# Patient Record
Sex: Female | Born: 1980 | Marital: Married | Smoking: Never smoker
Health system: Southern US, Community
[De-identification: ages and names within clinical notes are randomized; demographics above are authoritative.]

## PROBLEM LIST (undated history)

## (undated) DIAGNOSIS — F419 Anxiety disorder, unspecified: Secondary | ICD-10-CM

## (undated) HISTORY — DX: Anxiety disorder, unspecified: F41.9

---
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
## 2016-06-12 DIAGNOSIS — D2262 Melanocytic nevi of left upper limb, including shoulder: Secondary | ICD-10-CM | POA: Diagnosis not present

## 2016-06-12 DIAGNOSIS — D225 Melanocytic nevi of trunk: Secondary | ICD-10-CM | POA: Diagnosis not present

## 2016-06-12 DIAGNOSIS — D485 Neoplasm of uncertain behavior of skin: Secondary | ICD-10-CM | POA: Diagnosis not present

## 2016-06-12 DIAGNOSIS — C44519 Basal cell carcinoma of skin of other part of trunk: Secondary | ICD-10-CM | POA: Diagnosis not present

## 2016-06-12 DIAGNOSIS — D2362 Other benign neoplasm of skin of left upper limb, including shoulder: Secondary | ICD-10-CM | POA: Diagnosis not present

## 2016-06-12 DIAGNOSIS — L821 Other seborrheic keratosis: Secondary | ICD-10-CM | POA: Diagnosis not present

## 2016-09-30 DIAGNOSIS — Z01419 Encounter for gynecological examination (general) (routine) without abnormal findings: Secondary | ICD-10-CM | POA: Diagnosis not present

## 2016-09-30 DIAGNOSIS — Z6820 Body mass index (BMI) 20.0-20.9, adult: Secondary | ICD-10-CM | POA: Diagnosis not present

## 2016-10-07 DIAGNOSIS — Z85828 Personal history of other malignant neoplasm of skin: Secondary | ICD-10-CM | POA: Diagnosis not present

## 2016-10-07 DIAGNOSIS — L814 Other melanin hyperpigmentation: Secondary | ICD-10-CM | POA: Diagnosis not present

## 2016-10-07 DIAGNOSIS — D1801 Hemangioma of skin and subcutaneous tissue: Secondary | ICD-10-CM | POA: Diagnosis not present

## 2016-10-07 DIAGNOSIS — D225 Melanocytic nevi of trunk: Secondary | ICD-10-CM | POA: Diagnosis not present

## 2016-10-07 DIAGNOSIS — D485 Neoplasm of uncertain behavior of skin: Secondary | ICD-10-CM | POA: Diagnosis not present

## 2016-10-07 DIAGNOSIS — L821 Other seborrheic keratosis: Secondary | ICD-10-CM | POA: Diagnosis not present

## 2016-10-16 DIAGNOSIS — D225 Melanocytic nevi of trunk: Secondary | ICD-10-CM | POA: Diagnosis not present

## 2016-10-16 DIAGNOSIS — D485 Neoplasm of uncertain behavior of skin: Secondary | ICD-10-CM | POA: Diagnosis not present

## 2017-02-24 ENCOUNTER — Telehealth: Payer: Self-pay | Admitting: Emergency Medicine

## 2017-02-24 ENCOUNTER — Encounter: Payer: Self-pay | Admitting: Emergency Medicine

## 2017-02-24 ENCOUNTER — Ambulatory Visit: Payer: Self-pay | Admitting: Family Medicine

## 2017-02-24 NOTE — Telephone Encounter (Signed)
Pre visit attempted left voicemail for pt to return call to office.  

## 2017-02-24 NOTE — Telephone Encounter (Signed)
Pre visit complete °

## 2017-02-26 ENCOUNTER — Ambulatory Visit: Payer: Self-pay | Admitting: Family Medicine

## 2017-02-27 ENCOUNTER — Encounter: Payer: Self-pay | Admitting: Family Medicine

## 2017-02-27 ENCOUNTER — Ambulatory Visit (INDEPENDENT_AMBULATORY_CARE_PROVIDER_SITE_OTHER): Payer: Federal, State, Local not specified - PPO | Admitting: Family Medicine

## 2017-02-27 VITALS — BP 106/68 | HR 76 | Temp 98.5°F | Ht 68.0 in | Wt 136.0 lb

## 2017-02-27 DIAGNOSIS — Z1231 Encounter for screening mammogram for malignant neoplasm of breast: Secondary | ICD-10-CM

## 2017-02-27 DIAGNOSIS — K6289 Other specified diseases of anus and rectum: Secondary | ICD-10-CM

## 2017-02-27 DIAGNOSIS — Z1239 Encounter for other screening for malignant neoplasm of breast: Secondary | ICD-10-CM

## 2017-02-27 DIAGNOSIS — K594 Anal spasm: Secondary | ICD-10-CM

## 2017-02-27 DIAGNOSIS — F419 Anxiety disorder, unspecified: Secondary | ICD-10-CM | POA: Diagnosis not present

## 2017-02-27 DIAGNOSIS — Z0001 Encounter for general adult medical examination with abnormal findings: Secondary | ICD-10-CM

## 2017-02-27 DIAGNOSIS — Z0000 Encounter for general adult medical examination without abnormal findings: Secondary | ICD-10-CM

## 2017-02-27 LAB — COMPREHENSIVE METABOLIC PANEL
ALT: 9 U/L (ref 0–35)
AST: 13 U/L (ref 0–37)
Albumin: 4.4 g/dL (ref 3.5–5.2)
Alkaline Phosphatase: 52 U/L (ref 39–117)
BUN: 4 mg/dL — ABNORMAL LOW (ref 6–23)
CO2: 28 mEq/L (ref 19–32)
Calcium: 9.4 mg/dL (ref 8.4–10.5)
Chloride: 105 mEq/L (ref 96–112)
Creatinine, Ser: 0.78 mg/dL (ref 0.40–1.20)
GFR: 87.62 mL/min (ref >60.00)
Glucose, Bld: 91 mg/dL (ref 70–99)
Potassium: 4.7 mEq/L (ref 3.5–5.1)
Sodium: 140 mEq/L (ref 135–145)
Total Bilirubin: 0.6 mg/dL (ref 0.2–1.2)
Total Protein: 6.6 g/dL (ref 6.0–8.3)

## 2017-02-27 LAB — LIPID PANEL
Cholesterol: 139 mg/dL (ref 0–200)
HDL: 68.7 mg/dL (ref >39.00)
LDL Cholesterol: 58 mg/dL (ref 0–99)
NonHDL: 70.33
Total CHOL/HDL Ratio: 2
Triglycerides: 64 mg/dL (ref 0.0–149.0)
VLDL: 12.8 mg/dL (ref 0.0–40.0)

## 2017-02-27 LAB — CBC WITH DIFFERENTIAL/PLATELET
Basophils Absolute: 0 10*3/uL (ref 0.0–0.1)
Basophils Relative: 0.9 % (ref 0.0–3.0)
Eosinophils Absolute: 0.2 10*3/uL (ref 0.0–0.7)
Eosinophils Relative: 4.2 % (ref 0.0–5.0)
HCT: 42.2 % (ref 36.0–46.0)
Hemoglobin: 14 g/dL (ref 12.0–15.0)
Lymphocytes Relative: 25.3 % (ref 12.0–46.0)
Lymphs Abs: 1.2 10*3/uL (ref 0.7–4.0)
MCHC: 33.3 g/dL (ref 30.0–36.0)
MCV: 91 fl (ref 78.0–100.0)
Monocytes Absolute: 0.3 10*3/uL (ref 0.1–1.0)
Monocytes Relative: 5.7 % (ref 3.0–12.0)
Neutro Abs: 3.1 10*3/uL (ref 1.4–7.7)
Neutrophils Relative %: 63.9 % (ref 43.0–77.0)
Platelets: 226 10*3/uL (ref 150.0–400.0)
RBC: 4.64 Mil/uL (ref 3.87–5.11)
RDW: 12.9 % (ref 11.5–15.5)
WBC: 4.8 10*3/uL (ref 4.0–10.5)

## 2017-02-27 LAB — T4, FREE: Free T4: 0.68 ng/dL (ref 0.60–1.60)

## 2017-02-27 LAB — TSH: TSH: 1.49 u[IU]/mL (ref 0.35–4.50)

## 2017-02-27 NOTE — Patient Instructions (Signed)
Follow up in 1 year for physical or sooner if needed.

## 2017-02-28 NOTE — Progress Notes (Signed)
Subjective:    Barbara Mason is a 36 y.o. female and is here for a comprehensive physical exam.  Pertinent Gynecological History: Patient's last menstrual period was 02/02/2017. Sexually active: Yes with female. Menses: regular every month without intermenstrual spotting Contraception: none DES exposure: denies Blood transfusions: none Sexually transmitted diseases: no past history Previous GYN Procedures: none  Last mammogram: FAMILY HISTORY OF BREAST CANCER - REQ MAMMOGRAM REFERRAL TODAY. Last pap: normal Date: 2017, self reported.  See Assessment and Plan section for Problem Based Charting of further issues discussed today.  Health Maintenance Due  Topic Date Due  . HIV Screening  09/02/1993  . TETANUS/TDAP  09/02/1997  . PAP SMEAR  09/03/1999   PMHx, SurgHx, SocialHx, Medications, and Allergies were reviewed in the Visit Navigator and updated as appropriate.   Past Medical History:  Diagnosis Date  . Anxiety     Past Surgical History:  Procedure Laterality Date  . WISDOM TOOTH EXTRACTION  1997    Family History  Problem Relation Age of Onset  . Heart disease Father   . High Cholesterol Father   . Hypertension Sister   . Cancer Maternal Grandmother   . Diabetes Maternal Grandmother   . High Cholesterol Maternal Grandmother   . Cancer Paternal Grandmother   . High Cholesterol Paternal Grandmother    Social History  Substance Use Topics  . Smoking status: Never Smoker  . Smokeless tobacco: Never Used  . Alcohol use 0.6 oz/week    1 Glasses of wine per week   Review of Systems:   Pertinent items are noted in the HPI. Otherwise, ROS is negative.  Objective:   BP 106/68   Pulse 76   Temp 98.5 F (36.9 C) (Oral)   Ht 5\' 8"  (1.727 m)   Wt 136 lb (61.7 kg)   LMP 02/02/2017   SpO2 99%   BMI 20.68 kg/m    Wt Readings from Last 3 Encounters:  02/27/17 136 lb (61.7 kg)     Ht Readings from Last 3 Encounters:  02/27/17 5\' 8"  (1.727 m)    General appearance: alert, cooperative and appears stated age. Head: normocephalic, without obvious abnormality, atraumatic. Neck: no adenopathy, supple, symmetrical, trachea midline; thyroid not enlarged, symmetric, no tenderness/mass/nodules. Lungs: clear to auscultation bilaterally. Heart: regular rate and rhythm Abdomen: soft, non-tender; no masses,  no organomegaly. Extremities: extremities normal, atraumatic, no cyanosis or edema. Skin: skin color, texture, turgor normal, no rashes or lesions. Lymph: cervical, supraclavicular, and axillary nodes normal; no abnormal inguinal nodes palpated. Neurologic: grossly normal.  Results for orders placed or performed in visit on 02/27/17  CBC with Differential/Platelet  Result Value Ref Range   WBC 4.8 4.0 - 10.5 K/uL   RBC 4.64 3.87 - 5.11 Mil/uL   Hemoglobin 14.0 12.0 - 15.0 g/dL   HCT 16.142.2 09.636.0 - 04.546.0 %   MCV 91.0 78.0 - 100.0 fl   MCHC 33.3 30.0 - 36.0 g/dL   RDW 40.912.9 81.111.5 - 91.415.5 %   Platelets 226.0 150.0 - 400.0 K/uL   Neutrophils Relative % 63.9 43.0 - 77.0 %   Lymphocytes Relative 25.3 12.0 - 46.0 %   Monocytes Relative 5.7 3.0 - 12.0 %   Eosinophils Relative 4.2 0.0 - 5.0 %   Basophils Relative 0.9 0.0 - 3.0 %   Neutro Abs 3.1 1.4 - 7.7 K/uL   Lymphs Abs 1.2 0.7 - 4.0 K/uL   Monocytes Absolute 0.3 0.1 - 1.0 K/uL   Eosinophils Absolute 0.2 0.0 -  0.7 K/uL   Basophils Absolute 0.0 0.0 - 0.1 K/uL  Comprehensive metabolic panel  Result Value Ref Range   Sodium 140 135 - 145 mEq/L   Potassium 4.7 3.5 - 5.1 mEq/L   Chloride 105 96 - 112 mEq/L   CO2 28 19 - 32 mEq/L   Glucose, Bld 91 70 - 99 mg/dL   BUN 4 (L) 6 - 23 mg/dL   Creatinine, Ser 1.61 0.40 - 1.20 mg/dL   Total Bilirubin 0.6 0.2 - 1.2 mg/dL   Alkaline Phosphatase 52 39 - 117 U/L   AST 13 0 - 37 U/L   ALT 9 0 - 35 U/L   Total Protein 6.6 6.0 - 8.3 g/dL   Albumin 4.4 3.5 - 5.2 g/dL   Calcium 9.4 8.4 - 09.6 mg/dL   GFR 04.54 >09.81 mL/min  Lipid panel  Result  Value Ref Range   Cholesterol 139 0 - 200 mg/dL   Triglycerides 19.1 0.0 - 149.0 mg/dL   HDL 47.82 >95.62 mg/dL   VLDL 13.0 0.0 - 86.5 mg/dL   LDL Cholesterol 58 0 - 99 mg/dL   Total CHOL/HDL Ratio 2    NonHDL 70.33   TSH  Result Value Ref Range   TSH 1.49 0.35 - 4.50 uIU/mL  T4, free  Result Value Ref Range   Free T4 0.68 0.60 - 1.60 ng/dL   Assessment/Plan:   Barbara Mason was seen today for establish care.  Diagnoses and all orders for this visit:  Routine physical examination -     Lipid panel  Rectal pain Comments: Spasmodic pain that wakes her from sleep and lasts around 30 minutes, made better with Motrin. Happens once a month. No known trigger.  Orders: -     CBC with Differential/Platelet -     Comprehensive metabolic panel  Screening for breast cancer -     MM SCREENING BREAST TOMO BILATERAL; Future  Anxiety Comments: Early morning anxiety. Orders: -     TSH -     T4, free   Patient Counseling:   [x]     Nutrition: Stressed importance of moderation in sodium/caffeine intake, saturated fat and cholesterol, caloric balance, sufficient intake of fresh fruits, vegetables, fiber, calcium, iron, and 1 mg of folate supplement per day (for females capable of pregnancy).   [x]      Stressed the importance of regular exercise.    [x]     Substance Abuse: Discussed cessation/primary prevention of tobacco, alcohol, or other drug use; driving or other dangerous activities under the influence; availability of treatment for abuse.    [x]      Injury prevention: Discussed safety belts, safety helmets, smoke detector, smoking near bedding or upholstery.    []      Sexuality: Discussed sexually transmitted diseases, partner selection, use of condoms, avoidance of unintended pregnancy  and contraceptive alternatives.    [x]     Dental health: Discussed importance of regular tooth brushing, flossing, and dental visits.   [x]      Health maintenance and immunizations  reviewed. Please refer to Health maintenance section.   [name], DO [facility]

## 2017-03-02 ENCOUNTER — Other Ambulatory Visit: Payer: Federal, State, Local not specified - PPO

## 2017-03-02 DIAGNOSIS — Z1211 Encounter for screening for malignant neoplasm of colon: Secondary | ICD-10-CM

## 2017-03-02 LAB — HEMOCCULT SLIDES (X 3 CARDS)
Fecal Occult Blood: NEGATIVE
OCCULT 1: NEGATIVE
OCCULT 2: NEGATIVE
OCCULT 3: NEGATIVE
OCCULT 4: NEGATIVE
OCCULT 5: NEGATIVE

## 2017-03-06 ENCOUNTER — Ambulatory Visit: Payer: Self-pay | Admitting: Family Medicine

## 2017-03-15 ENCOUNTER — Encounter: Payer: Self-pay | Admitting: Family Medicine

## 2017-03-15 NOTE — Addendum Note (Signed)
Addended by: Helane RimaWALLACE, Masyn Rostro R on: 03/15/2017 07:58 PM   Modules accepted: Orders

## 2017-04-09 ENCOUNTER — Encounter: Payer: Self-pay | Admitting: Family Medicine

## 2017-04-16 ENCOUNTER — Encounter: Payer: Self-pay | Admitting: Gastroenterology

## 2017-04-20 ENCOUNTER — Ambulatory Visit
Admission: RE | Admit: 2017-04-20 | Discharge: 2017-04-20 | Disposition: A | Discharge status: Home / Self Care | Payer: Federal, State, Local not specified - PPO | Source: Ambulatory Visit | Attending: Family Medicine | Admitting: Family Medicine

## 2017-04-20 DIAGNOSIS — Z1231 Encounter for screening mammogram for malignant neoplasm of breast: Secondary | ICD-10-CM | POA: Diagnosis not present

## 2017-04-20 DIAGNOSIS — Z1239 Encounter for other screening for malignant neoplasm of breast: Secondary | ICD-10-CM

## 2017-04-27 ENCOUNTER — Encounter: Payer: Self-pay | Admitting: Family Medicine

## 2017-05-06 ENCOUNTER — Encounter: Payer: Self-pay | Admitting: Family Medicine

## 2017-06-08 ENCOUNTER — Ambulatory Visit: Payer: Federal, State, Local not specified - PPO | Admitting: Gastroenterology

## 2017-06-12 ENCOUNTER — Encounter: Payer: Self-pay | Admitting: Family Medicine

## 2017-06-16 DIAGNOSIS — Z85828 Personal history of other malignant neoplasm of skin: Secondary | ICD-10-CM | POA: Diagnosis not present

## 2017-06-16 DIAGNOSIS — D225 Melanocytic nevi of trunk: Secondary | ICD-10-CM | POA: Diagnosis not present

## 2017-09-07 ENCOUNTER — Encounter: Payer: Self-pay | Admitting: Family Medicine

## 2017-09-07 ENCOUNTER — Ambulatory Visit: Payer: Federal, State, Local not specified - PPO | Admitting: Family Medicine

## 2017-09-07 VITALS — BP 110/66 | HR 84 | Temp 98.6°F | Wt 136.6 lb

## 2017-09-07 DIAGNOSIS — J0100 Acute maxillary sinusitis, unspecified: Secondary | ICD-10-CM

## 2017-09-07 MED ORDER — AZITHROMYCIN 250 MG PO TABS: ORAL_TABLET | ORAL | 0 refills | Status: DC

## 2017-09-07 MED ORDER — PREDNISONE 5 MG PO TABS: ORAL_TABLET | ORAL | 0 refills | Status: DC

## 2017-09-07 NOTE — Progress Notes (Signed)
   Barbara Mason is a 37 y.o. female here for an acute visit.  History of Present Illness:   Barnie MortJoEllen Thompson, CMA acting as scribe for Dr. Helane RimaErica Sharon Stapel.   Sinusitis  This is a new problem. The current episode started in the past 7 days. The problem has been gradually worsening since onset. There has been no fever. Her pain is at a severity of 4/10. The pain is mild. Associated symptoms include congestion, coughing, ear pain, headaches and sinus pressure. Past treatments include oral decongestants and acetaminophen. The treatment provided mild relief.   PMHx, SurgHx, SocialHx, Medications, and Allergies were reviewed in the Visit Navigator and updated as appropriate.  Current Medications:   No current outpatient medications on file.   No Known Allergies   Review of Systems:   Pertinent items are noted in the HPI. Otherwise, ROS is negative.  Vitals:   Vitals:   09/07/17 0920  BP: 110/66  Pulse: 84  Temp: 98.6 F (37 C)  TempSrc: Oral  SpO2: 99%  Weight: 136 lb 9.6 oz (62 kg)     Body mass index is 20.77 kg/m.  Physical Exam:   Physical Exam  Constitutional: She is oriented to person, place, and time. She appears well-developed and well-nourished. No distress.  HENT:  Head: Normocephalic and atraumatic.  Right Ear: External ear normal.  Left Ear: External ear normal.  Nose: Mucosal edema and rhinorrhea present. Right sinus exhibits maxillary sinus tenderness. Left sinus exhibits maxillary sinus tenderness.  Mouth/Throat: Oropharynx is clear and moist.  Eyes: Conjunctivae and EOM are normal. Pupils are equal, round, and reactive to light.  Neck: Normal range of motion. Neck supple. No thyromegaly present.  Cardiovascular: Normal rate, regular rhythm, normal heart sounds and intact distal pulses.  Pulmonary/Chest: Effort normal and breath sounds normal.  Abdominal: Soft. Bowel sounds are normal.  Musculoskeletal: Normal range of motion.  Lymphadenopathy:   She has no cervical adenopathy.  Neurological: She is alert and oriented to person, place, and time.  Skin: Skin is warm and dry. Capillary refill takes less than 2 seconds.  Psychiatric: She has a normal mood and affect. Her behavior is normal.  Nursing note and vitals reviewed.   Assessment and Plan:   1. Subacute maxillary sinusitis - predniSONE (DELTASONE) 5 MG tablet; 6-5-4-3-2-1-off  Dispense: 21 tablet; Refill: 0 - azithromycin (ZITHROMAX) 250 MG tablet; 2 po on day 1, then one po daily until gone  Dispense: 6 tablet; Refill: 0   . Reviewed expectations re: course of current medical issues. . Discussed self-management of symptoms. . Outlined signs and symptoms indicating need for more acute intervention. . Patient verbalized understanding and all questions were answered. Marland Kitchen. Health Maintenance issues including appropriate healthy diet, exercise, and smoking avoidance were discussed with patient. . See orders for this visit as documented in the electronic medical record. . Patient received an After Visit Summary.  CMA served as Neurosurgeonscribe during this visit. History, Physical, and Plan performed by medical provider. The above documentation has been reviewed and is accurate and complete. Helane RimaErica Alixandrea Milleson, D.O.  Helane RimaErica Karell Tukes, DO [place] 09/07/2017

## 2017-10-08 DIAGNOSIS — Z85828 Personal history of other malignant neoplasm of skin: Secondary | ICD-10-CM | POA: Diagnosis not present

## 2017-10-08 DIAGNOSIS — Z86018 Personal history of other benign neoplasm: Secondary | ICD-10-CM | POA: Diagnosis not present

## 2017-10-08 DIAGNOSIS — D225 Melanocytic nevi of trunk: Secondary | ICD-10-CM | POA: Diagnosis not present

## 2017-10-08 DIAGNOSIS — D485 Neoplasm of uncertain behavior of skin: Secondary | ICD-10-CM | POA: Diagnosis not present

## 2017-10-08 DIAGNOSIS — L910 Hypertrophic scar: Secondary | ICD-10-CM | POA: Diagnosis not present

## 2017-11-04 DIAGNOSIS — L819 Disorder of pigmentation, unspecified: Secondary | ICD-10-CM | POA: Diagnosis not present

## 2018-01-11 DIAGNOSIS — Z6820 Body mass index (BMI) 20.0-20.9, adult: Secondary | ICD-10-CM | POA: Diagnosis not present

## 2018-01-11 DIAGNOSIS — Z01419 Encounter for gynecological examination (general) (routine) without abnormal findings: Secondary | ICD-10-CM | POA: Diagnosis not present

## 2018-01-11 LAB — HM PAP SMEAR: HM PAP: NEGATIVE

## 2018-02-25 DIAGNOSIS — F4322 Adjustment disorder with anxiety: Secondary | ICD-10-CM | POA: Diagnosis not present

## 2018-03-09 ENCOUNTER — Encounter: Payer: Federal, State, Local not specified - PPO | Admitting: Family Medicine

## 2018-03-09 DIAGNOSIS — Z0289 Encounter for other administrative examinations: Secondary | ICD-10-CM

## 2018-03-09 DIAGNOSIS — Z803 Family history of malignant neoplasm of breast: Secondary | ICD-10-CM | POA: Insufficient documentation

## 2018-03-09 DIAGNOSIS — K594 Anal spasm: Secondary | ICD-10-CM | POA: Insufficient documentation

## 2018-03-09 DIAGNOSIS — F419 Anxiety disorder, unspecified: Secondary | ICD-10-CM | POA: Insufficient documentation

## 2018-03-09 NOTE — Progress Notes (Deleted)
Subjective:    Barbara Mason is a 37 y.o. female and is here for a comprehensive physical exam.  No current outpatient medications on file.  Health Maintenance Due  Topic Date Due  . HIV Screening  09/02/1993  . TETANUS/TDAP  09/02/1997  . PAP SMEAR  09/03/1999   PMHx, SurgHx, SocialHx, Medications, and Allergies were reviewed in the Visit Navigator and updated as appropriate.   Past Medical History:  Diagnosis Date  . Anxiety      Past Surgical History:  Procedure Laterality Date  . WISDOM TOOTH EXTRACTION  1997     Family History  Problem Relation Age of Onset  . Heart disease Father   . High Cholesterol Father   . Hypertension Sister   . Cancer Maternal Grandmother   . Diabetes Maternal Grandmother   . High Cholesterol Maternal Grandmother   . Cancer Paternal Grandmother   . High Cholesterol Paternal Grandmother   . Breast cancer Neg Hx     Social History   Tobacco Use  . Smoking status: Never Smoker  . Smokeless tobacco: Never Used  Substance Use Topics  . Alcohol use: Yes    Alcohol/week: 0.6 oz    Types: 1 Glasses of wine per week  . Drug use: No    Review of Systems:   Pertinent items are noted in the HPI. Otherwise, ROS is negative.  Objective:   There were no vitals taken for this visit.   General appearance: alert, cooperative and appears stated age. Head: normocephalic, without obvious abnormality, atraumatic. Neck: no adenopathy, supple, symmetrical, trachea midline; thyroid not enlarged, symmetric, no tenderness/mass/nodules. Lungs: clear to auscultation bilaterally. Breasts: inspection negative, no nipple retraction or dimpling, no nipple discharge or bleeding, no axillary or supraclavicular adenopathy, normal to palpation without dominant masses. Heart: regular rate and rhythm Abdomen: soft, non-tender; no masses,  no organomegaly. Extremities: extremities normal, atraumatic, no cyanosis or edema. Skin: skin color, texture,  turgor normal, no rashes or lesions. Lymph: cervical, supraclavicular, and axillary nodes normal; no abnormal inguinal nodes palpated. Neurologic: grossly normal.  Pelvic:  External genitalia: no lesions.              Urethra: normal appearing urethra with no masses, tenderness or lesions.              Bartholins and Skenes: normal.               Vagina: normal appearing vagina with normal color and discharge, no lesions.              Cervix: normal appearance.              Pap and high risk HPV testing done: {yes no:314532}        Bimanual Exam:   Uterus: uterus is normal size, shape, consistency and nontender.                                      Adnexa: normal adnexa in size, nontender and no masses.  Assessment/Plan:   Diagnoses and all orders for this visit:  Routine physical examination  Screening for lipid disorders  Screening for HIV (human immunodeficiency virus)  Family history of breast cancer  Proctalgia fugax  Anxiety    Patient Counseling:   [x]     Nutrition: Stressed importance of moderation in sodium/caffeine intake, saturated fat and cholesterol, caloric balance, sufficient intake of fresh fruits, vegetables,  fiber, calcium, iron, and 1 mg of folate supplement per day (for females capable of pregnancy).   [x]      Stressed the importance of regular exercise.    [x]     Substance Abuse: Discussed cessation/primary prevention of tobacco, alcohol, or other drug use; driving or other dangerous activities under the influence; availability of treatment for abuse.    [x]      Injury prevention: Discussed safety belts, safety helmets, smoke detector, smoking near bedding or upholstery.    [x]      Sexuality: Discussed sexually transmitted diseases, partner selection, use of condoms, avoidance of unintended pregnancy  and contraceptive alternatives.    [x]     Dental health: Discussed importance of regular tooth brushing, flossing, and dental visits.   [x]       Health maintenance and immunizations reviewed. Please refer to Health maintenance section.   [name], DO [facility]

## 2018-03-11 ENCOUNTER — Encounter: Payer: Self-pay | Admitting: Family Medicine

## 2018-03-17 ENCOUNTER — Encounter: Payer: Self-pay | Admitting: Family Medicine

## 2018-03-18 NOTE — Progress Notes (Signed)
Barbara Mason is a 37 y.o. female is here for follow up.  History of Present Illness:   Barbara Mason, CMA acting as scribe for Dr. Helane Mason.   [id number] in office today for evaluation of blood sugar. She has several times that she would break out in a cold sweat and became shaky. She has noticed increased fatigue. She states no changes in her diet and eats several small meals a day and follows a vegan diet.   Health Maintenance Due  Topic Date Due  . HIV Screening  09/02/1993  . TETANUS/TDAP  09/02/1997  . PAP SMEAR  09/03/1999  . INFLUENZA VACCINE  03/11/2018   Depression screen PHQ 2/9 03/19/2018  Decreased Interest 0  Down, Depressed, Hopeless 0  PHQ - 2 Score 0     PMHx, SurgHx, SocialHx, FamHx, Medications, and Allergies were reviewed in the Visit Navigator and updated as appropriate.   Patient Active Problem List   Diagnosis Date Noted  . Family history of breast cancer 03/09/2018  . 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 Anxiety 03/09/2018   Social History   Tobacco Use  . Smoking status: Never Smoker  . Smokeless tobacco: Never Used  Substance Use Topics  . Alcohol use: Yes    Alcohol/week: 1.0 standard drinks    Types: 1 Glasses of wine per week  . Drug use: No   Current Medications and Allergies:   No current outpatient medications on file.  No Known Allergies   Review of Systems   Pertinent items are noted in the HPI. Otherwise, ROS is negative.  Vitals:   Vitals:   03/19/18 0805  BP: 108/62  Pulse: 67  Temp: 98.6 F (37 C)  TempSrc: Oral  SpO2: 97%  Weight: 134 lb 12.8 oz (61.1 kg)  Height: 5\' 8"  (1.727 m)     Body mass index is 20.5 kg/m.  Physical Exam:   Physical Exam  Constitutional: She appears well-nourished.  HENT:  Head: Normocephalic and atraumatic.  Eyes: Pupils are equal, round, and reactive to light. EOM are normal.  Neck: Normal range of motion. Neck supple.  Cardiovascular: Normal rate, regular rhythm,  normal heart sounds and intact distal pulses.  Pulmonary/Chest: Effort normal.  Abdominal: Soft.  Skin: Skin is warm.  Psychiatric: She has a normal mood and affect. Her behavior is normal.  Nursing note and vitals reviewed.   Assessment and Plan:   Barbara Mason was seen today for follow-up.  Diagnoses and all orders for this visit:  Hypoglycemia -     POCT glycosylated hemoglobin (Hb A1C)  Encounter for screening for HIV -     HIV antibody  Other fatigue Comments: Concern for hypoglycemic events. Glucometer provided for data. Diet and hypoglycemia tx reviewed.  Orders: -     Comprehensive metabolic panel -     CBC with Differential/Platelet -     TSH -     Vitamin B12 -     Lipid panel    . Reviewed expectations re: course of current medical issues. . Discussed self-management of symptoms. . Outlined signs and symptoms indicating need for more acute intervention. . Patient verbalized understanding and all questions were answered. Marland Kitchen Health Maintenance issues including appropriate healthy diet, exercise, and smoking avoidance were discussed with patient. . See orders for this visit as documented in the electronic medical record. . Patient received an After Visit Summary.  Barbara Rima, DO [place] 03/19/2018  No future appointments.   CMA served as Neurosurgeon  during this visit. History, Physical, and Plan performed by medical provider. The above documentation has been reviewed and is accurate and complete. Barbara RimaErica Aldrick Derrig, D.O.

## 2018-03-19 ENCOUNTER — Ambulatory Visit: Payer: Federal, State, Local not specified - PPO | Admitting: Family Medicine

## 2018-03-19 ENCOUNTER — Encounter: Payer: Self-pay | Admitting: Family Medicine

## 2018-03-19 VITALS — BP 108/62 | HR 67 | Temp 98.6°F | Ht 68.0 in | Wt 134.8 lb

## 2018-03-19 DIAGNOSIS — R5383 Other fatigue: Secondary | ICD-10-CM

## 2018-03-19 DIAGNOSIS — Z114 Encounter for screening for human immunodeficiency virus [HIV]: Secondary | ICD-10-CM | POA: Diagnosis not present

## 2018-03-19 DIAGNOSIS — E162 Hypoglycemia, unspecified: Secondary | ICD-10-CM

## 2018-03-19 LAB — COMPREHENSIVE METABOLIC PANEL
ALT: 10 U/L (ref 0–35)
AST: 14 U/L (ref 0–37)
Albumin: 4.5 g/dL (ref 3.5–5.2)
Alkaline Phosphatase: 61 U/L (ref 39–117)
BUN: 7 mg/dL (ref 6–23)
CO2: 30 mEq/L (ref 19–32)
Calcium: 9.5 mg/dL (ref 8.4–10.5)
Chloride: 104 mEq/L (ref 96–112)
Creatinine, Ser: 0.88 mg/dL (ref 0.40–1.20)
GFR: 75.82 mL/min (ref >60.00)
Glucose, Bld: 91 mg/dL (ref 70–99)
Potassium: 4.7 mEq/L (ref 3.5–5.1)
Sodium: 141 mEq/L (ref 135–145)
Total Bilirubin: 0.6 mg/dL (ref 0.2–1.2)
Total Protein: 7 g/dL (ref 6.0–8.3)

## 2018-03-19 LAB — VITAMIN B12: Vitamin B-12: 144 pg/mL — ABNORMAL LOW (ref 211–911)

## 2018-03-19 LAB — CBC WITH DIFFERENTIAL/PLATELET
Basophils Absolute: 0 10*3/uL (ref 0.0–0.1)
Basophils Relative: 0.8 % (ref 0.0–3.0)
Eosinophils Absolute: 0.2 10*3/uL (ref 0.0–0.7)
Eosinophils Relative: 4.6 % (ref 0.0–5.0)
HCT: 41.4 % (ref 36.0–46.0)
Hemoglobin: 14.1 g/dL (ref 12.0–15.0)
Lymphocytes Relative: 30.9 % (ref 12.0–46.0)
Lymphs Abs: 1.4 10*3/uL (ref 0.7–4.0)
MCHC: 34.1 g/dL (ref 30.0–36.0)
MCV: 90 fl (ref 78.0–100.0)
Monocytes Absolute: 0.3 10*3/uL (ref 0.1–1.0)
Monocytes Relative: 6.8 % (ref 3.0–12.0)
Neutro Abs: 2.7 10*3/uL (ref 1.4–7.7)
Neutrophils Relative %: 56.9 % (ref 43.0–77.0)
Platelets: 240 10*3/uL (ref 150.0–400.0)
RBC: 4.6 Mil/uL (ref 3.87–5.11)
RDW: 13 % (ref 11.5–15.5)
WBC: 4.7 10*3/uL (ref 4.0–10.5)

## 2018-03-19 LAB — POCT GLYCOSYLATED HEMOGLOBIN (HGB A1C): Hemoglobin A1C: 4.7 % (ref 4.0–5.6)

## 2018-03-19 LAB — LIPID PANEL
Cholesterol: 143 mg/dL (ref 0–200)
HDL: 68.6 mg/dL (ref >39.00)
LDL Cholesterol: 56 mg/dL (ref 0–99)
NonHDL: 74.17
Total CHOL/HDL Ratio: 2
Triglycerides: 89 mg/dL (ref 0.0–149.0)
VLDL: 17.8 mg/dL (ref 0.0–40.0)

## 2018-03-19 LAB — TSH: TSH: 1.91 u[IU]/mL (ref 0.35–4.50)

## 2018-03-20 LAB — HIV ANTIBODY (ROUTINE TESTING W REFLEX): HIV 1&2 Ab, 4th Generation: NONREACTIVE

## 2018-03-30 ENCOUNTER — Encounter: Payer: Self-pay | Admitting: Family Medicine

## 2018-04-11 DIAGNOSIS — S61213A Laceration without foreign body of left middle finger without damage to nail, initial encounter: Secondary | ICD-10-CM | POA: Diagnosis not present

## 2018-04-19 ENCOUNTER — Encounter: Payer: Self-pay | Admitting: Family Medicine

## 2018-04-19 ENCOUNTER — Ambulatory Visit: Payer: Federal, State, Local not specified - PPO | Admitting: Family Medicine

## 2018-04-19 VITALS — BP 104/66 | HR 70 | Temp 98.4°F | Ht 68.0 in | Wt 134.0 lb

## 2018-04-19 DIAGNOSIS — R5383 Other fatigue: Secondary | ICD-10-CM | POA: Diagnosis not present

## 2018-04-19 DIAGNOSIS — R5381 Other malaise: Secondary | ICD-10-CM

## 2018-04-19 DIAGNOSIS — E538 Deficiency of other specified B group vitamins: Secondary | ICD-10-CM | POA: Diagnosis not present

## 2018-04-19 MED ORDER — CYANOCOBALAMIN 1000 MCG/ML IJ SOLN
1000.0000 ug | Freq: Once | INTRAMUSCULAR | Status: AC
Start: 2018-04-19 — End: 2018-04-19
  Administered 2018-04-19: 1000 ug via INTRAMUSCULAR

## 2018-04-19 NOTE — Progress Notes (Signed)
   Edward Minder is a 37 y.o. female is here for follow up.  History of Present Illness:   Britt Bottom CMA acting as scribe for Dr. Earlene Plater.  HPI: Patient comes in today for a follow up. Patient states that she is still having some fatigue. She said that she feels like she is hungry all the time.   Health Maintenance Due  Topic Date Due  . TETANUS/TDAP  09/02/1997  . INFLUENZA VACCINE  03/11/2018   Depression screen PHQ 2/9 03/19/2018  Decreased Interest 0  Down, Depressed, Hopeless 0  PHQ - 2 Score 0   PMHx, SurgHx, SocialHx, FamHx, Medications, and Allergies were reviewed in the Visit Navigator and updated as appropriate.   Patient Active Problem List   Diagnosis Date Noted  . Family history of breast cancer 03/09/2018  . 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 Anxiety 03/09/2018   Social History   Tobacco Use  . Smoking status: Never Smoker  . Smokeless tobacco: Never Used  Substance Use Topics  . Alcohol use: Yes    Alcohol/week: 1.0 standard drinks    Types: 1 Glasses of wine per week  . Drug use: No   Current Medications and Allergies:   Current Outpatient Medications:  Marland Kitchen  Multiple Vitamin (MULTIVITAMIN) tablet, Take 1 tablet by mouth daily., Disp: , Rfl:   No Known Allergies Review of Systems   Pertinent items are noted in the HPI. Otherwise, ROS is negative.  Vitals:   Vitals:   04/19/18 1458  BP: 104/66  Pulse: 70  Temp: 98.4 F (36.9 C)  TempSrc: Oral  SpO2: 99%  Weight: 134 lb (60.8 kg)  Height: 5\' 8"  (1.727 m)     Body mass index is 20.37 kg/m.  Physical Exam:   Physical Exam  Constitutional: She appears well-developed and well-nourished.  HENT:  Head: Normocephalic and atraumatic.  Eyes: Pupils are equal, round, and reactive to light. Conjunctivae and EOM are normal.  Cardiovascular: Normal rate.  Pulmonary/Chest: Effort normal.  Nursing note and vitals reviewed.  Assessment and Plan:   Kelcy was seen today for  follow-up.  Diagnoses and all orders for this visit:  Vitamin B 12 deficiency -     cyanocobalamin ((VITAMIN B-12)) injection 1,000 mcg  Malaise and fatigue   . Reviewed expectations re: course of current medical issues. . Discussed self-management of symptoms. . Outlined signs and symptoms indicating need for more acute intervention. . Patient verbalized understanding and all questions were answered. Marland Kitchen Health Maintenance issues including appropriate healthy diet, exercise, and smoking avoidance were discussed with patient. . See orders for this visit as documented in the electronic medical record. . Patient received an After Visit Summary.  CMA served as Neurosurgeon during this visit. History, Physical, and Plan performed by medical provider. The above documentation has been reviewed and is accurate and complete. [name], D.O.  [name], DO [place] 04/19/2018

## 2018-04-21 ENCOUNTER — Ambulatory Visit (INDEPENDENT_AMBULATORY_CARE_PROVIDER_SITE_OTHER): Payer: Federal, State, Local not specified - PPO | Admitting: Surgical

## 2018-04-21 DIAGNOSIS — E538 Deficiency of other specified B group vitamins: Secondary | ICD-10-CM

## 2018-04-21 MED ORDER — CYANOCOBALAMIN 1000 MCG/ML IJ SOLN
1000.0000 ug | Freq: Once | INTRAMUSCULAR | Status: AC
  Administered 2018-04-21: 1000 ug via INTRAMUSCULAR

## 2018-04-21 NOTE — Progress Notes (Signed)
Patient comes in today for her second B 12 injection. B 12 given in right deltoid. Patient tolerated well.  Patient is going to start giving herself the B 12 injections at home. Went over how to give herself the injections. Patient repeated back to me how she is going to give the injection. I will send medication to the pharmacy.

## 2018-04-22 ENCOUNTER — Other Ambulatory Visit: Payer: Self-pay

## 2018-04-22 ENCOUNTER — Encounter: Payer: Self-pay | Admitting: Family Medicine

## 2018-04-22 ENCOUNTER — Telehealth: Payer: Self-pay | Admitting: Family Medicine

## 2018-04-22 MED ORDER — "SYRINGE 25G X 1"" 3 ML MISC": 1.0000 "application " | 0 refills | Status: DC

## 2018-04-22 MED ORDER — CYANOCOBALAMIN 1000 MCG/ML IJ SOLN: INTRAMUSCULAR | 15 refills | Status: DC

## 2018-04-22 NOTE — Telephone Encounter (Signed)
Copied from CRM [id number]. Topic: Quick Communication - Rx Refill/Question >> Apr 22, 2018  4:32 PM Alexander BergeronBarksdale, Barbara B wrote: Medication: b12 injections  Pt called to get clarification on the directions on the medication above on how to take; contact to advise

## 2018-04-22 NOTE — Telephone Encounter (Signed)
See note

## 2018-04-22 NOTE — Telephone Encounter (Signed)
Called patient informed will call in script.

## 2018-04-23 NOTE — Telephone Encounter (Signed)
Left message for patient to return call.

## 2018-06-01 ENCOUNTER — Encounter: Payer: Self-pay | Admitting: Family Medicine

## 2018-06-01 ENCOUNTER — Ambulatory Visit: Payer: Federal, State, Local not specified - PPO | Admitting: Family Medicine

## 2018-06-01 VITALS — BP 102/64 | HR 71 | Temp 98.7°F | Ht 68.0 in | Wt 140.6 lb

## 2018-06-01 DIAGNOSIS — E538 Deficiency of other specified B group vitamins: Secondary | ICD-10-CM | POA: Diagnosis not present

## 2018-06-01 DIAGNOSIS — G4710 Hypersomnia, unspecified: Secondary | ICD-10-CM | POA: Diagnosis not present

## 2018-06-01 LAB — POCT MONO (EPSTEIN BARR VIRUS): Mono, POC: NEGATIVE

## 2018-06-01 MED ORDER — ARMODAFINIL 50 MG PO TABS: 100.0000 mg | ORAL_TABLET | Freq: Every day | ORAL | 1 refills | Status: DC

## 2018-06-01 NOTE — Progress Notes (Signed)
Barbara Mason is a 37 y.o. female is here for follow up.  History of Present Illness:   HPI:  Barbara Mason is a 37 y.o. female who presents for evaluation of fatigue. Symptoms began several months ago. Symptoms of her fatigue have been general malaise and hypersomnolence. Patient describes the following psychological symptoms: stress at school and stress in the family. Patient denies change in hair texture, cold intolerance, constipation, excessive menstrual bleeding, exercise intolerance, fever, GI blood loss, significant change in weight, symptoms of arthritis, unusual rashes and witnessed or suspected sleep apnea. Symptoms have stabilized. Symptom severity: struggles to carry out day to day responsibilities.. Previous visits for this problem: see last visit.   Depression screen PHQ 2/9 03/19/2018  Decreased Interest 0  Down, Depressed, Hopeless 0  PHQ - 2 Score 0   PMHx, SurgHx, SocialHx, FamHx, Medications, and Allergies were reviewed in the Visit Navigator and updated as appropriate.   Patient Active Problem List   Diagnosis Date Noted  . Family history of breast cancer 03/09/2018  . 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 Anxiety 03/09/2018   Social History   Tobacco Use  . Smoking status: Never Smoker  . Smokeless tobacco: Never Used  Substance Use Topics  . Alcohol use: Yes    Alcohol/week: 1.0 standard drinks    Types: 1 Glasses of wine per week  . Drug use: No   Current Medications and Allergies:   .  cyanocobalamin (,VITAMIN B-12,) 1000 MCG/ML injection, 1000 mcg (1 mg) injection once per week for four weeks, followed by 1000 mcg injection once per month., Disp: 1 mL, Rfl: 15 .  Multiple Vitamin (MULTIVITAMIN) tablet, Take 1 tablet by mouth daily., Disp: , Rfl:  .  Syringe/Needle, Disp, (SYRINGE 3CC/25GX1") 25G X 1" 3 ML MISC, 1 application by Does not apply route once a week., Disp: 12 each, Rfl: 0  No Known Allergies   Review of Systems   Pertinent items  are noted in the HPI. Otherwise, ROS is negative.  Vitals:   Vitals:   06/01/18 1520  BP: 102/64  Pulse: 71  Temp: 98.7 F (37.1 C)  TempSrc: Oral  SpO2: 98%  Weight: 140 lb 9.6 oz (63.8 kg)  Height: 5\' 8"  (1.727 m)     Body mass index is 21.38 kg/m.  Physical Exam:   Physical Exam  Constitutional: She appears well-nourished.  HENT:  Head: Normocephalic and atraumatic.  Eyes: Pupils are equal, round, and reactive to light. EOM are normal.  Neck: Normal range of motion. Neck supple.  Cardiovascular: Normal rate, regular rhythm, normal heart sounds and intact distal pulses.  Pulmonary/Chest: Effort normal.  Abdominal: Soft.  Skin: Skin is warm.  Psychiatric: She has a normal mood and affect. Her behavior is normal.  Nursing note and vitals reviewed.  Assessment and Plan:   Barbara Mason was seen today for follow-up.  Diagnoses and all orders for this visit:  Hypersomnia -     Vitamin B12 -     Thyroid peroxidase antibody -     Armodafinil 50 MG tablet; Take 2 tablets (100 mg total) by mouth daily. -     POCT Mono (Epstein Barr Virus) -     CMV IgM -     B-HCG Quant  Vitamin B 12 deficiency -     Vitamin B12   . Reviewed expectations re: course of current medical issues. . Discussed self-management of symptoms. . Outlined signs and symptoms indicating need for more acute intervention. . Patient verbalized  understanding and all questions were answered. Marland Kitchen Health Maintenance issues including appropriate healthy diet, exercise, and smoking avoidance were discussed with patient. . See orders for this visit as documented in the electronic medical record. . Patient received an After Visit Summary.  [name], DO [place], [facility] 06/02/2018

## 2018-06-02 ENCOUNTER — Encounter: Payer: Self-pay | Admitting: Family Medicine

## 2018-06-02 LAB — VITAMIN B12: Vitamin B-12: 315 pg/mL (ref 211–911)

## 2018-06-02 LAB — HCG, QUANTITATIVE, PREGNANCY: Quantitative HCG: 0.11 m[IU]/mL

## 2018-06-02 NOTE — Telephone Encounter (Signed)
Patient is calling and she said she thinks the pharmacy needs a prior authorization on Armodafinil 50 MG tablet. Call back [phone]

## 2018-06-03 NOTE — Telephone Encounter (Signed)
Patient calling to check and see if the prior Authorization has been started. Please advise.

## 2018-06-03 NOTE — Telephone Encounter (Signed)
See note

## 2018-06-04 LAB — THYROID PEROXIDASE ANTIBODY: Thyroperoxidase Ab SerPl-aCnc: <1 IU/mL (ref <9)

## 2018-06-04 LAB — CMV IGM: CMV IgM: <30 AU/mL

## 2018-06-04 NOTE — Telephone Encounter (Signed)
PA for Armodafinil 50 mg BIN [id number] PCN [id number] RxGRP [id number]  This request has received a Favorable outcome. Please note any additional information provided by [employer] at the bottom of this request

## 2018-06-07 NOTE — Telephone Encounter (Signed)
Auth received for Armodafinil 80 mg.  Valid from 05/05/18 to 06/03/18  Copy sent to scan

## 2018-06-08 ENCOUNTER — Ambulatory Visit: Payer: Federal, State, Local not specified - PPO | Admitting: Family Medicine

## 2018-06-08 DIAGNOSIS — L821 Other seborrheic keratosis: Secondary | ICD-10-CM | POA: Diagnosis not present

## 2018-06-08 DIAGNOSIS — D225 Melanocytic nevi of trunk: Secondary | ICD-10-CM | POA: Diagnosis not present

## 2018-06-12 ENCOUNTER — Other Ambulatory Visit: Payer: Self-pay | Admitting: Family Medicine

## 2018-06-13 NOTE — Progress Notes (Deleted)
   Barbara Mason is a 37 y.o. female is here for follow up.  History of Present Illness:   HPI: See Assessment and Plan section for Problem Based Charting of issues discussed today.   There are no preventive care reminders to display for this patient. Depression screen PHQ 2/9 03/19/2018  Decreased Interest 0  Down, Depressed, Hopeless 0  PHQ - 2 Score 0   PMHx, SurgHx, SocialHx, FamHx, Medications, and Allergies were reviewed in the Visit Navigator and updated as appropriate.   Patient Active Problem List   Diagnosis Date Noted  . Family history of breast cancer 03/09/2018  . 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 Anxiety 03/09/2018   Social History   Tobacco Use  . Smoking status: Never Smoker  . Smokeless tobacco: Never Used  Substance Use Topics  . Alcohol use: Yes    Alcohol/week: 1.0 standard drinks    Types: 1 Glasses of wine per week  . Drug use: No   Current Medications and Allergies:   Current Outpatient Medications:  .  Armodafinil 50 MG tablet, Take 2 tablets (100 mg total) by mouth daily., Disp: 30 tablet, Rfl: 1 .  cyanocobalamin (,VITAMIN B-12,) 1000 MCG/ML injection, 1000 mcg (1 mg) injection once per week for four weeks, followed by 1000 mcg injection once per month., Disp: 1 mL, Rfl: 15 .  Multiple Vitamin (MULTIVITAMIN) tablet, Take 1 tablet by mouth daily., Disp: , Rfl:  .  Syringe/Needle, Disp, (SYRINGE 3CC/25GX1") 25G X 1" 3 ML MISC, 1 application by Does not apply route once a week., Disp: 12 each, Rfl: 0  No Known Allergies Review of Systems   Pertinent items are noted in the HPI. Otherwise, ROS is negative.  Vitals:  There were no vitals filed for this visit.   There is no height or weight on file to calculate BMI.  Physical Exam:   Physical Exam  Results for orders placed or performed in visit on 06/01/18  Vitamin B12  Result Value Ref Range   Vitamin B-12 315 211 - 911 pg/mL  Thyroid peroxidase antibody  Result Value Ref Range   Thyroperoxidase Ab SerPl-aCnc <1 <9 IU/mL  CMV IgM  Result Value Ref Range   CMV IgM <30.00 AU/mL  B-HCG Quant  Result Value Ref Range   Quantitative HCG 0.11 mIU/ml  POCT Mono (Epstein Barr Virus)  Result Value Ref Range   Mono, POC Negative Negative    Assessment and Plan:   No problem-specific Assessment & Plan notes found for this encounter.   No orders of the defined types were placed in this encounter.   No orders of the defined types were placed in this encounter.   . Reviewed expectations re: course of current medical issues. . Discussed self-management of symptoms. . Outlined signs and symptoms indicating need for more acute intervention. . Patient verbalized understanding and all questions were answered. Marland Kitchen Health Maintenance issues including appropriate healthy diet, exercise, and smoking avoidance were discussed with patient. . See orders for this visit as documented in the electronic medical record. . Patient received an After Visit Summary.  [name], DO [place], [facility] 06/13/2018

## 2018-06-14 ENCOUNTER — Ambulatory Visit: Payer: Federal, State, Local not specified - PPO | Admitting: Family Medicine

## 2018-06-14 ENCOUNTER — Telehealth: Payer: Self-pay | Admitting: Surgical

## 2018-06-14 NOTE — Telephone Encounter (Signed)
Patient states that the she does not feel like the Armodafinil is working for her. She would like to know if can change this or does she need to come in for an appointment?

## 2018-06-15 MED ORDER — AMPHETAMINE-DEXTROAMPHETAMINE 10 MG PO TABS: 10.0000 mg | ORAL_TABLET | Freq: Two times a day (BID) | ORAL | 0 refills | Status: DC

## 2018-06-15 NOTE — Addendum Note (Signed)
Addended by: [name] R on: 06/15/2018 07:43 AM   Modules accepted: Orders

## 2018-06-15 NOTE — Telephone Encounter (Signed)
Called patient gave info. F/u app moved out.

## 2018-06-15 NOTE — Telephone Encounter (Signed)
Stop Nuvigil and trial Adderall. I sent in the 10 mg dose to her pharmacy. She may take 1/2 dose at first. Follow up in one month.

## 2018-06-16 ENCOUNTER — Telehealth: Payer: Self-pay

## 2018-06-16 DIAGNOSIS — G4710 Hypersomnia, unspecified: Secondary | ICD-10-CM | POA: Insufficient documentation

## 2018-06-16 NOTE — Telephone Encounter (Signed)
Per insurance can only start auth over the phone not able to use cover my meds. Called [phone] to get auth. Was denied will send fax with reason to our office. Do you want to try something else?

## 2018-06-18 NOTE — Telephone Encounter (Signed)
Adderall denied for hypersomnia, failed Nuvigil? Can you tell me what others were listed a need to trial?

## 2018-06-21 NOTE — Telephone Encounter (Signed)
Holding for fax not received yet

## 2018-06-23 ENCOUNTER — Encounter: Payer: Self-pay | Admitting: Family Medicine

## 2018-06-29 ENCOUNTER — Ambulatory Visit: Payer: Federal, State, Local not specified - PPO | Admitting: Family Medicine

## 2018-07-01 NOTE — Telephone Encounter (Signed)
Pt is calling the adderall was denied and she would like update

## 2018-07-01 NOTE — Telephone Encounter (Signed)
See note

## 2018-07-02 NOTE — Telephone Encounter (Signed)
Fax not received do you want to try something else?

## 2018-07-02 NOTE — Telephone Encounter (Signed)
Pt following up on the request for adderal.  Pt hopes to hear something asap

## 2018-07-05 NOTE — Telephone Encounter (Signed)
I need to know what is covered 

## 2018-07-05 NOTE — Telephone Encounter (Signed)
fyi

## 2018-07-13 NOTE — Progress Notes (Signed)
ERROR. PATIENT NOT SEEN. 

## 2018-07-14 ENCOUNTER — Ambulatory Visit (INDEPENDENT_AMBULATORY_CARE_PROVIDER_SITE_OTHER): Payer: Federal, State, Local not specified - PPO | Admitting: Family Medicine

## 2018-07-14 DIAGNOSIS — R5383 Other fatigue: Secondary | ICD-10-CM

## 2018-07-15 NOTE — Telephone Encounter (Signed)
Patient came in office yesterday she will get filled found out on good rx on $24 cash pay she will make f/u in one month

## 2018-07-16 NOTE — Telephone Encounter (Signed)
Sent in auth  [place] (Key: [id number])   Your information has been submitted to [employer]. To check for an updated outcome later, reopen this PA request from your dashboard. If [employer] has not responded to your request within 24 hours, contact [employer] at [phone]. If you think there may be a problem with your PA request, use our live chat feature at the bottom right.

## 2018-08-17 ENCOUNTER — Ambulatory Visit: Payer: Federal, State, Local not specified - PPO | Admitting: Family Medicine

## 2018-08-30 ENCOUNTER — Encounter: Payer: Self-pay | Admitting: Family Medicine

## 2018-08-31 ENCOUNTER — Ambulatory Visit: Payer: Federal, State, Local not specified - PPO | Admitting: Family Medicine

## 2018-08-31 NOTE — Progress Notes (Deleted)
   Barbara Mason is a 38 y.o. female is here for follow up.  History of Present Illness:   {CMA SCRIBE ATTESTATION}  HPI:   There are no preventive care reminders to display for this patient. Depression screen PHQ 2/9 03/19/2018  Decreased Interest 0  Down, Depressed, Hopeless 0  PHQ - 2 Score 0   PMHx, SurgHx, SocialHx, FamHx, Medications, and Allergies were reviewed in the Visit Navigator and updated as appropriate.   Patient Active Problem List   Diagnosis Date Noted  . Hypersomnia 06/16/2018  . Family history of breast cancer 03/09/2018  . 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 Anxiety 03/09/2018   Social History   Tobacco Use  . Smoking status: Never Smoker  . Smokeless tobacco: Never Used  Substance Use Topics  . Alcohol use: Yes    Alcohol/week: 1.0 standard drinks    Types: 1 Glasses of wine per week  . Drug use: No   Current Medications and Allergies:   Current Outpatient Medications:  .  amphetamine-dextroamphetamine (ADDERALL) 10 MG tablet, Take 1 tablet (10 mg total) by mouth 2 (two) times daily., Disp: 60 tablet, Rfl: 0 .  Armodafinil 50 MG tablet, Take 2 tablets (100 mg total) by mouth daily., Disp: 30 tablet, Rfl: 1 .  cyanocobalamin (,VITAMIN B-12,) 1000 MCG/ML injection, INJECT ONCE PER WEEK FOR FOUR WEEKS, FOLLOWED BY INJECTION ONCE PER MONTH., Disp: 4 mL, Rfl: 3 .  Multiple Vitamin (MULTIVITAMIN) tablet, Take 1 tablet by mouth daily., Disp: , Rfl:  .  Syringe/Needle, Disp, (SYRINGE 3CC/25GX1") 25G X 1" 3 ML MISC, 1 application by Does not apply route once a week., Disp: 12 each, Rfl: 0  No Known Allergies Review of Systems   Pertinent items are noted in the HPI. Otherwise, a complete ROS is negative.  Vitals:  There were no vitals filed for this visit.   There is no height or weight on file to calculate BMI.  Physical Exam:   Physical Exam  Results for orders placed or performed in visit on 06/01/18  Vitamin B12  Result Value Ref  Range   Vitamin B-12 315 211 - 911 pg/mL  Thyroid peroxidase antibody  Result Value Ref Range   Thyroperoxidase Ab SerPl-aCnc <1 <9 IU/mL  CMV IgM  Result Value Ref Range   CMV IgM <30.00 AU/mL  B-HCG Quant  Result Value Ref Range   Quantitative HCG 0.11 mIU/ml  POCT Mono (Epstein Barr Virus)  Result Value Ref Range   Mono, POC Negative Negative    Assessment and Plan:   There are no diagnoses linked to this encounter.  . Orders and follow up as documented in EpicCare, reviewed diet, exercise and weight control, cardiovascular risk and specific lipid/LDL goals reviewed, reviewed medications and side effects in detail.  . Reviewed expectations re: course of current medical issues. . Outlined signs and symptoms indicating need for more acute intervention. . Patient verbalized understanding and all questions were answered. . Patient received an After Visit Summary.  *** CMA served as Neurosurgeon during this visit. History, Physical, and Plan performed by medical provider. The above documentation has been reviewed and is accurate and complete. [name], D.O.  [name], DO [place], [facility] 08/31/2018

## 2018-09-01 ENCOUNTER — Other Ambulatory Visit: Payer: Self-pay | Admitting: Family Medicine

## 2018-09-01 NOTE — Telephone Encounter (Signed)
Called pt to see how many pills she has left that and if she could schedule an earlier appt date so that she is able to get her Rx. Also to inform pt that Dr. Earlene PlaterWallace is not her approve the Rx today but will return Friday. However, patient did not answer phone call. Will try again later.

## 2018-09-01 NOTE — Telephone Encounter (Signed)
See note

## 2018-09-01 NOTE — Telephone Encounter (Signed)
Copied from CRM [id number]. Topic: Quick Communication - See Telephone Encounter >> Sep 01, 2018  7:39 AM Windy Kalata, NT wrote: CRM for notification. See Telephone encounter for: 09/01/18.  Patient has appointment scheduled on 09/21/18 with Dr. Earlene Plater and would like to know if her amphetamine-dextroamphetamine (ADDERALL) 10 MG tablet can be refilled to get her to the appointment.  [id number] [place], [place] - [address] [place] [address] Phone: [phone] Fax: [phone]

## 2018-09-02 ENCOUNTER — Other Ambulatory Visit: Payer: Self-pay | Admitting: Family Medicine

## 2018-09-02 NOTE — Telephone Encounter (Signed)
Rx request 

## 2018-09-03 MED ORDER — AMPHETAMINE-DEXTROAMPHETAMINE 10 MG PO TABS: 10.0000 mg | ORAL_TABLET | Freq: Two times a day (BID) | ORAL | 0 refills | Status: DC

## 2018-09-03 NOTE — Telephone Encounter (Signed)
See phone message

## 2018-09-06 MED ORDER — AMPHETAMINE-DEXTROAMPHETAMINE 10 MG PO TABS: 10.0000 mg | ORAL_TABLET | Freq: Two times a day (BID) | ORAL | 0 refills | Status: DC

## 2018-09-06 NOTE — Telephone Encounter (Signed)
Called patient she was informed that refill was called in. She has another app for follow up

## 2018-09-06 NOTE — Telephone Encounter (Signed)
Sent to pharmacy, one month refill. Should get her to appointment.

## 2018-09-08 ENCOUNTER — Telehealth: Payer: Self-pay

## 2018-09-08 NOTE — Telephone Encounter (Signed)
Barbara Mason (KeyOlena Heckle)   Your information has been submitted to [employer]. To check for an updated outcome later, reopen this PA request from your dashboard. If [employer] has not responded to your request within 24 hours, contact [employer] at [phone]. If you think there may be a problem with your PA request, use our live chat feature at the bottom right.

## 2018-09-14 NOTE — Telephone Encounter (Signed)
I believe that she doing cash pay.

## 2018-09-14 NOTE — Telephone Encounter (Signed)
Insurance denied medication. Not approved for hypersomnia

## 2018-09-17 NOTE — Telephone Encounter (Signed)
Yes noted

## 2018-09-21 ENCOUNTER — Ambulatory Visit: Payer: Federal, State, Local not specified - PPO | Admitting: Family Medicine

## 2018-09-21 ENCOUNTER — Encounter: Payer: Self-pay | Admitting: Family Medicine

## 2018-09-21 VITALS — BP 106/70 | HR 86 | Temp 99.1°F | Ht 68.0 in | Wt 132.4 lb

## 2018-09-21 DIAGNOSIS — G4710 Hypersomnia, unspecified: Secondary | ICD-10-CM

## 2018-09-21 NOTE — Progress Notes (Signed)
Barbara Mason is a 38 y.o. female is here for follow up.  History of Present Illness:   I acted as a Neurosurgeonscribe for W. R. BerkleyErica Daysen Gundrum, DO [employer]arkDonna Orphanos, LPN  HPI:   Hypersomnia Pt here for follow up on Adderall medication. Pt states is helping keep her awake taking only one tablet daily and occasionally has to take second tablet. No side effects. Feels "normal" when she takes it.    There are no preventive care reminders to display for this patient.   Depression screen PHQ 2/9 03/19/2018  Decreased Interest 0  Down, Depressed, Hopeless 0  PHQ - 2 Score 0   PMHx, SurgHx, SocialHx, FamHx, Medications, and Allergies were reviewed in the Visit Navigator and updated as appropriate.   Patient Active Problem List   Diagnosis Date Noted  . Hypersomnia 06/16/2018  . Family history of breast cancer 03/09/2018  . 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 Anxiety 03/09/2018   Social History   Tobacco Use  . Smoking status: Never Smoker  . Smokeless tobacco: Never Used  Substance Use Topics  . Alcohol use: Yes    Alcohol/week: 1.0 standard drinks    Types: 1 Glasses of wine per week  . Drug use: No   Current Medications and Allergies   .  amphetamine-dextroamphetamine (ADDERALL) 10 MG tablet, Take 1 tablet (10 mg total) by mouth 2 (two) times daily., Disp: 60 tablet, Rfl: 0 .  cyanocobalamin (,VITAMIN B-12,) 1000 MCG/ML injection, INJECT 1ML ONCE PER WEEK FOR FOUR WEEKS, FOLLOWED BY 1ML INJECTION ONCE PER MONTH., Disp: 4 mL, Rfl: 3 .  Syringe/Needle, Disp, (SYRINGE 3CC/25GX1") 25G X 1" 3 ML MISC, 1 application by Does not apply route once a week., Disp: 12 each, Rfl: 0  No Known Allergies   Review of Systems   Pertinent items are noted in the HPI. Otherwise, a complete ROS is negative.  Vitals   Vitals:   09/21/18 1508  BP: 106/70  Pulse: 86  Temp: 99.1 F (37.3 C)  TempSrc: Oral  SpO2: 98%  Weight: 132 lb 6.1 oz (60 kg)  Height: 5\' 8"  (1.727 m)     Body mass index is 20.13  kg/m.  Physical Exam   Physical Exam Vitals signs and nursing note reviewed.  HENT:     Head: Normocephalic and atraumatic.  Eyes:     Pupils: Pupils are equal, round, and reactive to light.  Neck:     Musculoskeletal: Normal range of motion and neck supple.  Cardiovascular:     Rate and Rhythm: Normal rate and regular rhythm.     Heart sounds: Normal heart sounds.  Pulmonary:     Effort: Pulmonary effort is normal.  Abdominal:     Palpations: Abdomen is soft.  Skin:    General: Skin is warm.  Psychiatric:        Behavior: Behavior normal.    Assessment and Plan   Barbara Mason was seen today for hypersomnia.  Diagnoses and all orders for this visit:  Hypersomnia Comments: Doing very well with medication. Will continue. Okay refill when she calls.    . Orders and follow up as documented in EpicCare, reviewed diet, exercise and weight control, cardiovascular risk and specific lipid/LDL goals reviewed, reviewed medications and side effects in detail.  . Reviewed expectations re: course of current medical issues. . Outlined signs and symptoms indicating need for more acute intervention. . Patient verbalized understanding and all questions were answered. . Patient received an After Visit Summary.  CMA served  as scribe during this visit. History, Physical, and Plan performed by medical provider. The above documentation has been reviewed and is accurate and complete. Barbara RimaErica Aziyah Mason, D.O.  Barbara RimaErica Halleigh Comes, DO [place], [facility] 09/21/2018

## 2018-10-17 IMAGING — MG 2D DIGITAL SCREENING BILATERAL MAMMOGRAM WITH CAD AND ADJUNCT TO
9 of 12 series · 9 of 28 positions shown · non-contrast
Comparison: Previous exam(s).

CLINICAL DATA: Screening.

EXAM:
2D DIGITAL SCREENING BILATERAL MAMMOGRAM WITH CAD AND ADJUNCT TOMO

[R MLO synth-2D]
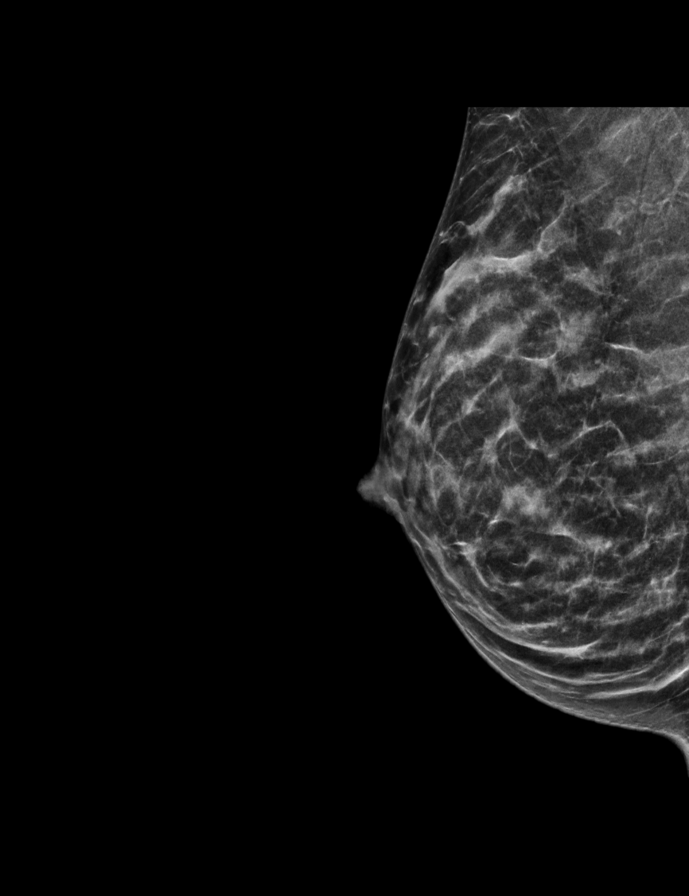

[L MLO synth-2D]
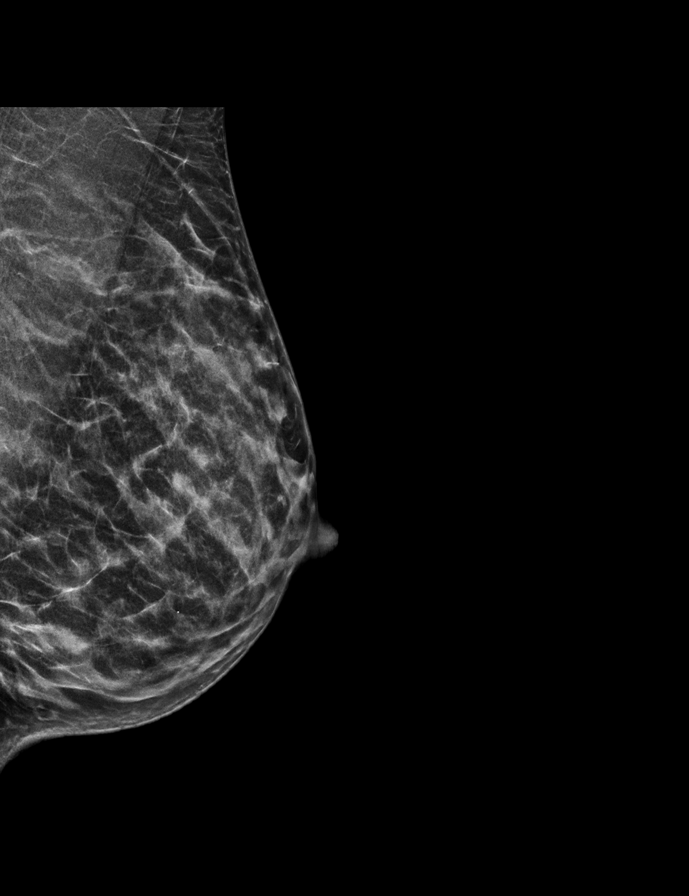

[L MLO]
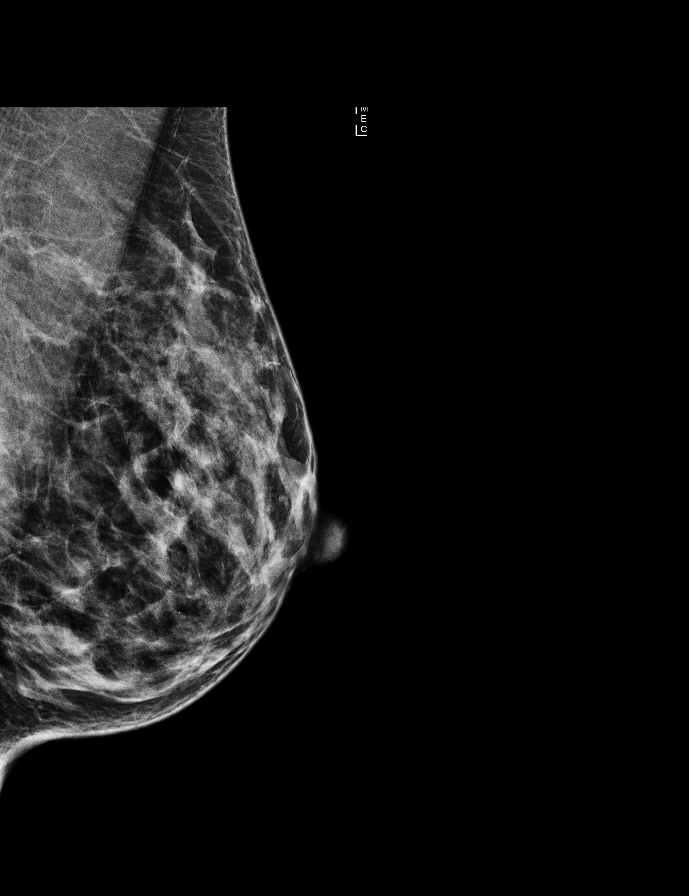

[R CC synth-2D]
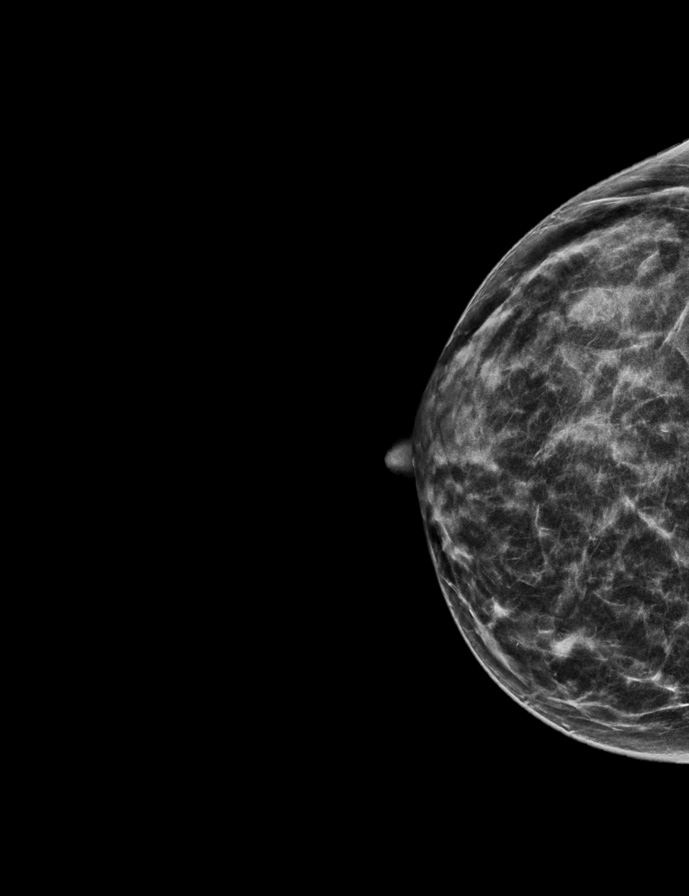

[R CC]
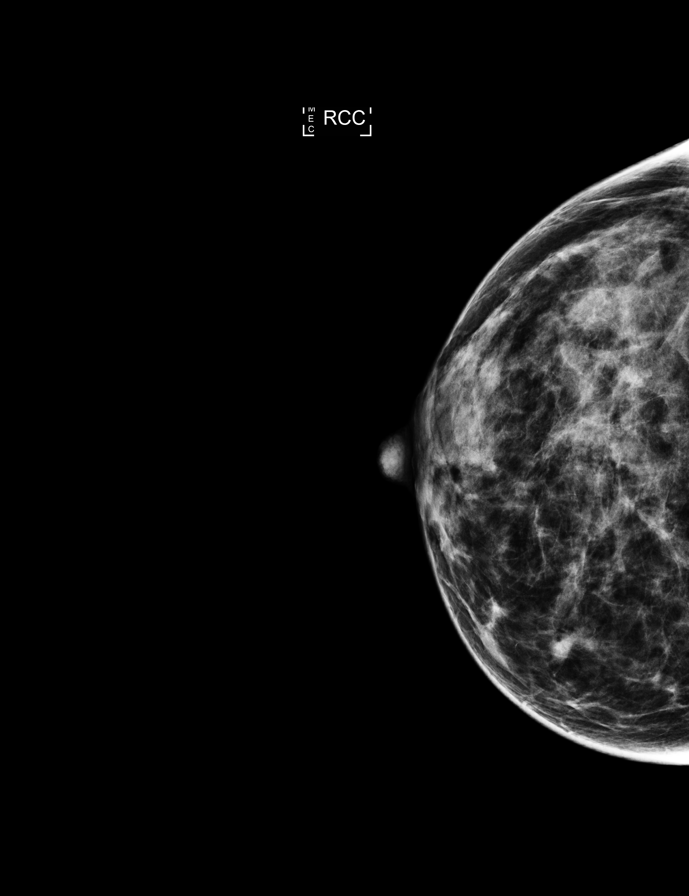

[R MLO]
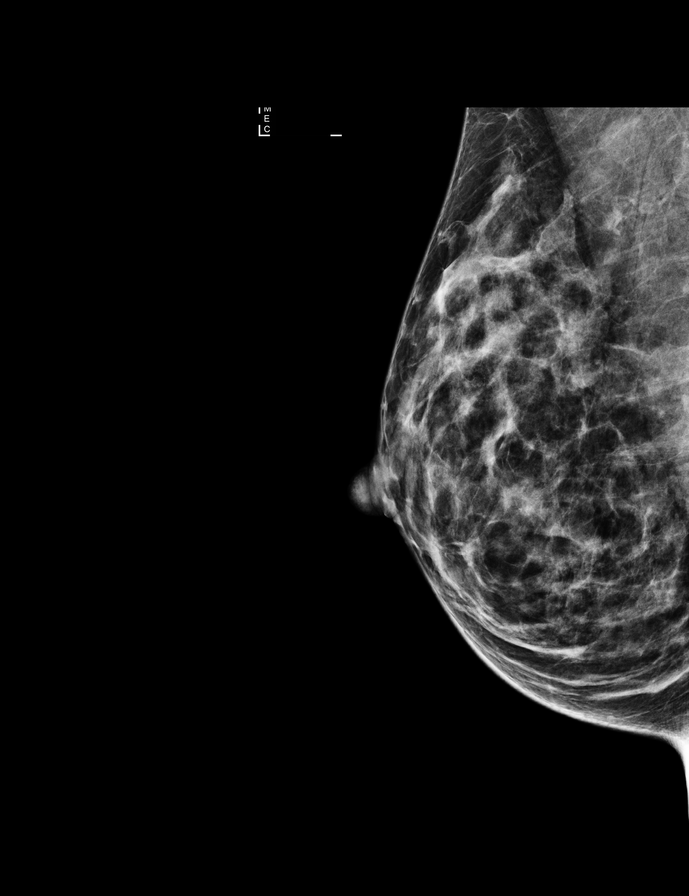

[L CC synth-2D]
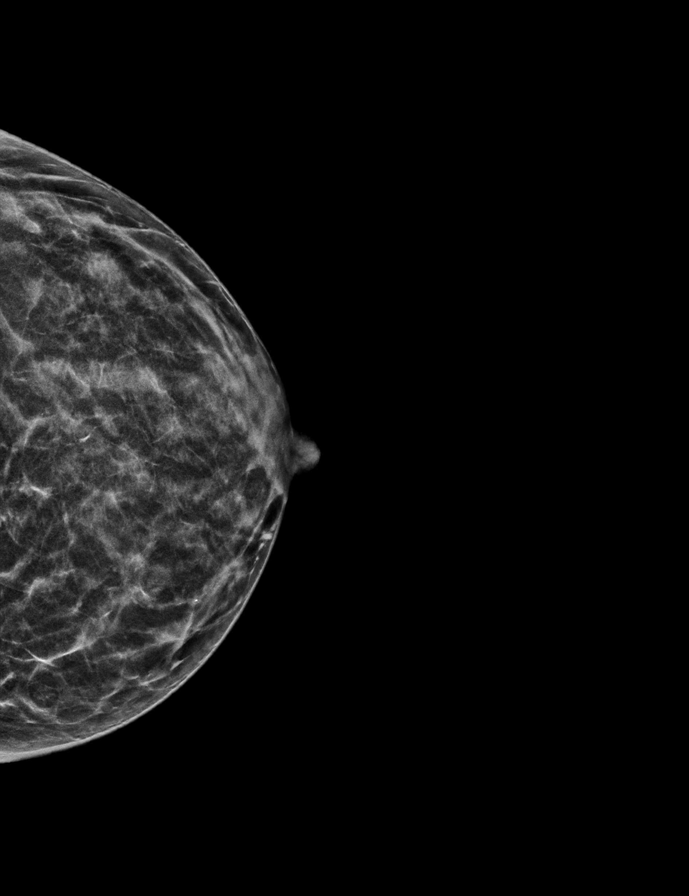

[L CC]
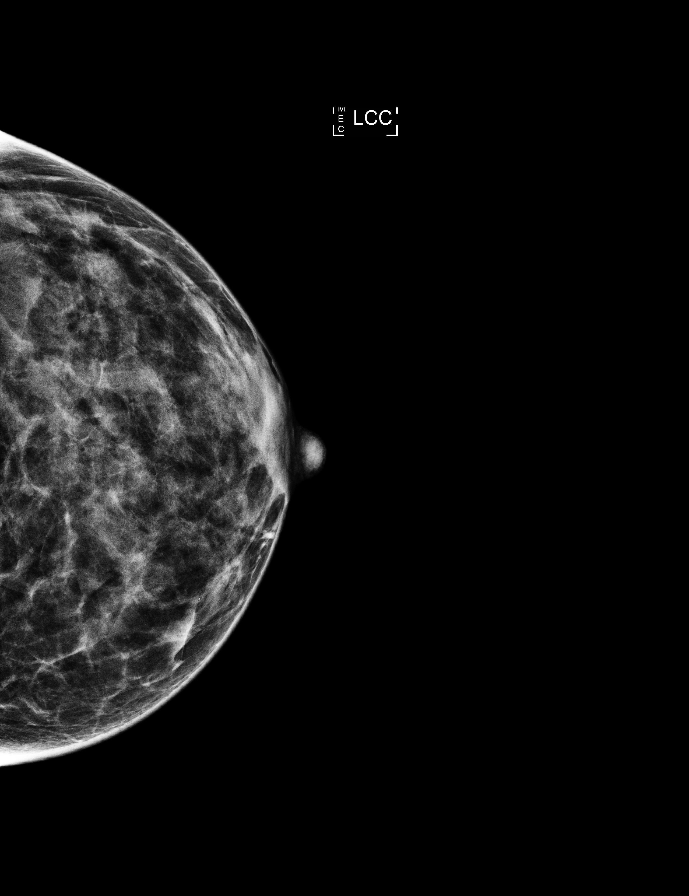

[L CC tomo · tomo slice 21/40.0]
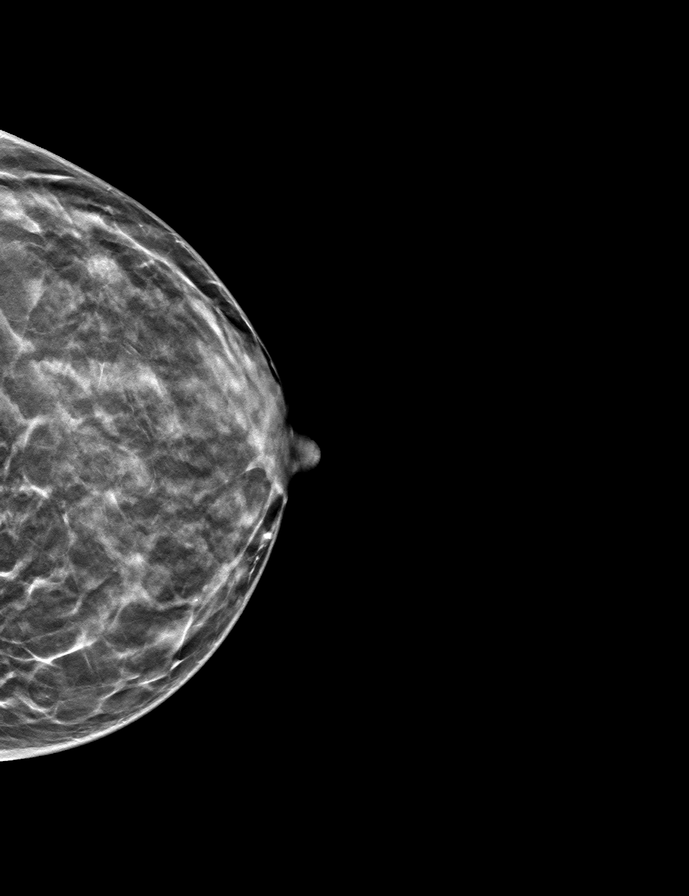

[9 of 28 positions shown; findings below may reference images not displayed]

ACR Breast Density Category c: The breast tissue is heterogeneously
dense, which may obscure small masses.
FINDINGS: There are no findings suspicious for malignancy. Images were
processed with CAD.
IMPRESSION: No mammographic evidence of malignancy. A result letter of this
screening mammogram will be mailed directly to the patient.

RECOMMENDATION:
Screening mammogram at age 38. (Code:B7-7-C2P)

BI-RADS CATEGORY  1: Negative.

## 2018-10-21 ENCOUNTER — Encounter: Payer: Self-pay | Admitting: Family Medicine

## 2018-10-21 NOTE — Telephone Encounter (Signed)
Please advise 

## 2018-10-25 ENCOUNTER — Other Ambulatory Visit: Payer: Self-pay

## 2018-10-25 ENCOUNTER — Other Ambulatory Visit: Payer: Self-pay | Admitting: Family Medicine

## 2018-10-25 MED ORDER — "SYRINGE 25G X 1"" 3 ML MISC": 1.0000 "application " | 0 refills | Status: DC

## 2018-10-25 MED ORDER — AMPHETAMINE-DEXTROAMPHETAMINE 10 MG PO TABS: 10.0000 mg | ORAL_TABLET | Freq: Two times a day (BID) | ORAL | 0 refills | Status: DC

## 2018-10-25 NOTE — Progress Notes (Signed)
Please tee up to send.

## 2018-10-26 MED ORDER — AMPHETAMINE-DEXTROAMPHETAMINE 10 MG PO TABS: 10.0000 mg | ORAL_TABLET | Freq: Two times a day (BID) | ORAL | 0 refills | Status: DC

## 2018-11-26 DIAGNOSIS — L570 Actinic keratosis: Secondary | ICD-10-CM | POA: Diagnosis not present

## 2018-11-26 DIAGNOSIS — L821 Other seborrheic keratosis: Secondary | ICD-10-CM | POA: Diagnosis not present

## 2019-01-03 ENCOUNTER — Other Ambulatory Visit: Payer: Self-pay | Admitting: Family Medicine

## 2019-01-04 MED ORDER — AMPHETAMINE-DEXTROAMPHETAMINE 10 MG PO TABS: 10.0000 mg | ORAL_TABLET | Freq: Two times a day (BID) | ORAL | 0 refills | Status: DC

## 2019-02-23 DIAGNOSIS — D485 Neoplasm of uncertain behavior of skin: Secondary | ICD-10-CM | POA: Diagnosis not present

## 2019-02-23 DIAGNOSIS — Z85828 Personal history of other malignant neoplasm of skin: Secondary | ICD-10-CM | POA: Diagnosis not present

## 2019-02-23 DIAGNOSIS — D225 Melanocytic nevi of trunk: Secondary | ICD-10-CM | POA: Diagnosis not present

## 2019-02-23 DIAGNOSIS — Z86018 Personal history of other benign neoplasm: Secondary | ICD-10-CM | POA: Diagnosis not present

## 2019-02-23 DIAGNOSIS — L910 Hypertrophic scar: Secondary | ICD-10-CM | POA: Diagnosis not present

## 2019-02-23 DIAGNOSIS — B079 Viral wart, unspecified: Secondary | ICD-10-CM | POA: Diagnosis not present

## 2019-03-08 ENCOUNTER — Other Ambulatory Visit: Payer: Self-pay | Admitting: Family Medicine

## 2019-03-08 ENCOUNTER — Encounter: Payer: Self-pay | Admitting: Family Medicine

## 2019-03-10 ENCOUNTER — Encounter: Payer: Self-pay | Admitting: Family Medicine

## 2019-03-10 ENCOUNTER — Ambulatory Visit (INDEPENDENT_AMBULATORY_CARE_PROVIDER_SITE_OTHER): Payer: Federal, State, Local not specified - PPO | Admitting: Family Medicine

## 2019-03-10 ENCOUNTER — Other Ambulatory Visit: Payer: Self-pay

## 2019-03-10 VITALS — Ht 68.0 in | Wt 132.0 lb

## 2019-03-10 DIAGNOSIS — G4710 Hypersomnia, unspecified: Secondary | ICD-10-CM | POA: Diagnosis not present

## 2019-03-10 DIAGNOSIS — E538 Deficiency of other specified B group vitamins: Secondary | ICD-10-CM

## 2019-03-10 DIAGNOSIS — Z789 Other specified health status: Secondary | ICD-10-CM | POA: Diagnosis not present

## 2019-03-10 LAB — CBC WITH DIFFERENTIAL/PLATELET
Basophils Absolute: 0.1 10*3/uL (ref 0.0–0.1)
Basophils Relative: 1.2 % (ref 0.0–3.0)
Eosinophils Absolute: 0.1 10*3/uL (ref 0.0–0.7)
Eosinophils Relative: 3 % (ref 0.0–5.0)
HCT: 41.5 % (ref 36.0–46.0)
Hemoglobin: 13.7 g/dL (ref 12.0–15.0)
Lymphocytes Relative: 30.5 % (ref 12.0–46.0)
Lymphs Abs: 1.3 10*3/uL (ref 0.7–4.0)
MCHC: 32.9 g/dL (ref 30.0–36.0)
MCV: 92.5 fl (ref 78.0–100.0)
Monocytes Absolute: 0.3 10*3/uL (ref 0.1–1.0)
Monocytes Relative: 6.1 % (ref 3.0–12.0)
Neutro Abs: 2.5 10*3/uL (ref 1.4–7.7)
Neutrophils Relative %: 59.2 % (ref 43.0–77.0)
Platelets: 248 10*3/uL (ref 150.0–400.0)
RBC: 4.49 Mil/uL (ref 3.87–5.11)
RDW: 13.2 % (ref 11.5–15.5)
WBC: 4.3 10*3/uL (ref 4.0–10.5)

## 2019-03-10 LAB — COMPREHENSIVE METABOLIC PANEL
ALT: 10 U/L (ref 0–35)
AST: 16 U/L (ref 0–37)
Albumin: 4.7 g/dL (ref 3.5–5.2)
Alkaline Phosphatase: 56 U/L (ref 39–117)
BUN: 7 mg/dL (ref 6–23)
CO2: 28 mEq/L (ref 19–32)
Calcium: 9.6 mg/dL (ref 8.4–10.5)
Chloride: 106 mEq/L (ref 96–112)
Creatinine, Ser: 0.87 mg/dL (ref 0.40–1.20)
GFR: 71.93 mL/min (ref >60.00)
Glucose, Bld: 95 mg/dL (ref 70–99)
Potassium: 4.9 mEq/L (ref 3.5–5.1)
Sodium: 141 mEq/L (ref 135–145)
Total Bilirubin: 0.8 mg/dL (ref 0.2–1.2)
Total Protein: 6.8 g/dL (ref 6.0–8.3)

## 2019-03-10 LAB — VITAMIN B12: Vitamin B-12: 190 pg/mL — ABNORMAL LOW (ref 211–911)

## 2019-03-10 MED ORDER — AMPHETAMINE-DEXTROAMPHETAMINE 20 MG PO TABS: 20.0000 mg | ORAL_TABLET | Freq: Three times a day (TID) | ORAL | 0 refills | Status: DC | PRN

## 2019-03-10 NOTE — Progress Notes (Signed)
Virtual Visit via Video   Due to the COVID-19 pandemic, this visit was completed with telemedicine (audio/video) technology to reduce patient and provider exposure as well as to preserve personal protective equipment.   I connected with Barbara Mason by a video enabled telemedicine application and verified that I am speaking with the correct person using two identifiers. Location patient: Home Location provider: [facility], Office Persons participating in the virtual visit: Barbara Mason, Barbara RimaErica Marvin Grabill, DO Barbara Mason, CMA acting as scribe for Dr. Helane RimaErica Jefferey Lippmann.   I discussed the limitations of evaluation and management by telemedicine and the availability of in person appointments. The patient expressed understanding and agreed to proceed.  Care Team   Patient Care Team: Barbara Mason, Barbara Chim, DO as PCP - General (Family Medicine)  Subjective:   HPI:   Patient is currently taking Adderall 20 bid. She does not take twice a day often it keep her up at night.   Since the last visit has the patient had any:  Appetite changes? No Unintentional weight loss? No Is medication working well ? Yes Does patient take drug holidays? Yes  Difficulties falling to sleep or maintaining sleep? No Any anxiety?  No Any cardiac issues (fainting or paliptations)? No Suicidal thoughts? No Changes in health since last visit? No New medications? No Any illicit substance abuse? No Has the patient taken his medication today? Yes  Review of Systems  Constitutional: Negative for chills and fever.  HENT: Negative for hearing loss and tinnitus.   Eyes: Negative for blurred vision and double vision.  Respiratory: Negative for cough and wheezing.   Cardiovascular: Negative for chest pain, palpitations and leg swelling.  Gastrointestinal: Negative for nausea and vomiting.  Genitourinary: Negative for dysuria and urgency.  Neurological: Negative for dizziness and headaches.    Psychiatric/Behavioral: Negative for depression and suicidal ideas.     Patient Active Problem List   Diagnosis Date Noted  . Hypersomnia 06/16/2018  . Family history of breast cancer 03/09/2018  . 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 Anxiety 03/09/2018    Social History   Tobacco Use  . Smoking status: Never Smoker  . Smokeless tobacco: Never Used  Substance Use Topics  . Alcohol use: Yes    Alcohol/week: 1.0 standard drinks    Types: 1 Glasses of wine per week   Current Outpatient Medications:  .  amphetamine-dextroamphetamine (ADDERALL) 10 MG tablet, Take 1 tablet (10 mg total) by mouth 2 (two) times daily., Disp: 60 tablet, Rfl: 0 .  cyanocobalamin (,VITAMIN B-12,) 1000 MCG/ML injection, INJECT 1ML ONCE PER WEEK FOR FOUR WEEKS, FOLLOWED BY 1ML INJECTION ONCE PER MONTH., Disp: 4 mL, Rfl: 3 .  Syringe/Needle, Disp, (SYRINGE 3CC/25GX1") 25G X 1" 3 ML MISC, 1 application by Does not apply route once a week., Disp: 12 each, Rfl: 0  No Known Allergies  Objective:   VITALS: Per patient if applicable, see vitals. GENERAL: Alert, appears well and in no acute distress. HEENT: Atraumatic, conjunctiva clear, no obvious abnormalities on inspection of external nose and ears. NECK: Normal movements of the head and neck. CARDIOPULMONARY: No increased WOB. Speaking in clear sentences. I:E ratio WNL.  MS: Moves all visible extremities without noticeable abnormality. PSYCH: Pleasant and cooperative, well-groomed. Speech normal rate and rhythm. Affect is appropriate. Insight and judgement are appropriate. Attention is focused, linear, and appropriate.  NEURO: CN grossly intact. Oriented as arrived to appointment on time with no prompting. Moves both UE equally.  SKIN: No obvious lesions, wounds, erythema, or cyanosis  noted on face or hands.  Depression screen [facility] 2/9 03/10/2019 03/19/2018  Decreased Interest 0 0  Down, Depressed, Hopeless 0 0  PHQ - 2 Score 0 0  Altered sleeping 0 -  Tired,  decreased energy 0 -  Change in appetite 0 -  Feeling bad or failure about yourself  0 -  Trouble concentrating 0 -  Moving slowly or fidgety/restless 0 -  Suicidal thoughts 0 -  PHQ-9 Score 0 -  Difficult doing work/chores Not difficult at all -   Assessment and Plan:   Barbara Mason was seen today for medication refill.  Diagnoses and all orders for this visit:  Hypersomnia Comments: Doing well on current regimen.  Will refill today.  Database checked today.  No concerns. Orders: -     amphetamine-dextroamphetamine (ADDERALL) 20 MG tablet; Take 1 tablet (20 mg total) by mouth 3 (three) times daily as needed. -     amphetamine-dextroamphetamine (ADDERALL) 20 MG tablet; Take 1 tablet (20 mg total) by mouth 3 (three) times daily as needed. -     amphetamine-dextroamphetamine (ADDERALL) 20 MG tablet; Take 1 tablet (20 mg total) by mouth 3 (three) times daily as needed.  Vitamin B 12 deficiency Comments: Weeks patient is currently injecting B12 once monthly at home.  Next injection is tomorrow.  Will check labs today. Orders: -     CBC with Differential/Platelet -     Vitamin B12  Vegan diet Comments: Vegan diet.  Healthy weight.  B12 deficiency. Orders: -     Comprehensive metabolic panel -     Iron, TIBC and Ferritin Panel   . COVID-19 Education: The signs and symptoms of COVID-19 were discussed with the patient and how to seek care for testing if needed. The importance of social distancing was discussed today. . Reviewed expectations re: course of current medical issues. . Discussed self-management of symptoms. . Outlined signs and symptoms indicating need for more acute intervention. . Patient verbalized understanding and all questions were answered. Marland Kitchen Health Maintenance issues including appropriate healthy diet, exercise, and smoking avoidance were discussed with patient. . See orders for this visit as documented in the electronic medical record.  Barbara Deutscher,  DO  Records requested if needed. Time spent: 25 minutes, of which >50% was spent in obtaining information about her symptoms, reviewing her previous labs, evaluations, and treatments, counseling her about her condition (please see the discussed topics above), and developing a plan to further investigate it; she had a number of questions which I addressed.

## 2019-03-11 DIAGNOSIS — E538 Deficiency of other specified B group vitamins: Secondary | ICD-10-CM | POA: Insufficient documentation

## 2019-03-11 LAB — IRON,TIBC AND FERRITIN PANEL
%SAT: 35 % (calc) (ref 16–45)
Ferritin: 23 ng/mL (ref 16–154)
Iron: 128 ug/dL (ref 40–190)
TIBC: 366 mcg/dL (calc) (ref 250–450)

## 2019-03-11 MED ORDER — CYANOCOBALAMIN 1000 MCG/ML IJ SOLN: INTRAMUSCULAR | 3 refills | Status: DC

## 2019-03-11 NOTE — Addendum Note (Signed)
Addended by: Briscoe Deutscher R on: 03/11/2019 01:05 PM   Modules accepted: Orders

## 2019-03-12 ENCOUNTER — Encounter: Payer: Self-pay | Admitting: Family Medicine

## 2019-03-14 NOTE — Telephone Encounter (Signed)
FYI pre Josem Kaufmann has been started as well   [place]: [id number] - PA Case ID: [id number] - Rx #: [id number] Need help? Call us at [phone] Status Sent to [facility] Amphetamine-Dextroamphetamine 20MG  tablets Form FEP Electronic PA Form (NCPDP) Original Claim Info 75 FOR OVERRIDE HAVE MD CALL [place]

## 2019-03-17 NOTE — Telephone Encounter (Signed)
Patient also wanted to know what results were for iron. Gave those no questions at this time.

## 2019-03-17 NOTE — Telephone Encounter (Signed)
Called patient and let her know that insurance not covered. She is aware

## 2019-05-16 ENCOUNTER — Encounter: Payer: Self-pay | Admitting: Family Medicine

## 2019-05-16 NOTE — Telephone Encounter (Signed)
Copied from [place] [id number]. Topic: General - Inquiry >> May 16, 2019  4:20 PM Richardo Priest, [place] wrote: Reason for CRM: Patient called in stating she would like to ask Dr.Wallace one follow up question she has from a past appointment. Please advise. Call back is [phone]. Patient is free tomorrow after 3:00.

## 2019-05-18 ENCOUNTER — Encounter: Payer: Self-pay | Admitting: Family Medicine

## 2019-05-25 ENCOUNTER — Encounter: Payer: Self-pay | Admitting: Family Medicine

## 2019-05-25 ENCOUNTER — Ambulatory Visit: Payer: Federal, State, Local not specified - PPO | Admitting: Family Medicine

## 2019-05-25 DIAGNOSIS — G4710 Hypersomnia, unspecified: Secondary | ICD-10-CM

## 2019-05-25 NOTE — Progress Notes (Signed)
Phone call with patient regarding her medications.  She continues to take 10 mg tab Adderall p.o. every morning as needed hypersomnia.  Her focus is improved.  No weight loss.  She has started yoga so feels that her body composition has changed somewhat.  No concerns for mood changes. Briscoe Deutscher, DO

## 2019-06-30 DIAGNOSIS — Z03818 Encounter for observation for suspected exposure to other biological agents ruled out: Secondary | ICD-10-CM | POA: Diagnosis not present

## 2019-06-30 DIAGNOSIS — Z20828 Contact with and (suspected) exposure to other viral communicable diseases: Secondary | ICD-10-CM | POA: Diagnosis not present

## 2019-07-13 DIAGNOSIS — Z20818 Contact with and (suspected) exposure to other bacterial communicable diseases: Secondary | ICD-10-CM | POA: Diagnosis not present

## 2019-08-12 ENCOUNTER — Other Ambulatory Visit: Payer: Self-pay | Admitting: Family Medicine

## 2019-08-12 DIAGNOSIS — G4710 Hypersomnia, unspecified: Secondary | ICD-10-CM

## 2019-08-16 NOTE — Telephone Encounter (Signed)
Rx request 

## 2019-08-17 MED ORDER — AMPHETAMINE-DEXTROAMPHETAMINE 20 MG PO TABS: 20.0000 mg | ORAL_TABLET | Freq: Three times a day (TID) | ORAL | 0 refills | Status: DC | PRN

## 2019-08-17 NOTE — Telephone Encounter (Signed)
Please let her know she has to be seen every 3 months for controlled drugs. She has no f/u scheduled with any providers here. I will send her in a month of requested medication but will need to establish care here or with another primary care physician for further refills.   Thanks!  Dr. Artis Flock

## 2019-09-14 DIAGNOSIS — J111 Influenza due to unidentified influenza virus with other respiratory manifestations: Secondary | ICD-10-CM | POA: Diagnosis not present

## 2019-09-14 DIAGNOSIS — R05 Cough: Secondary | ICD-10-CM | POA: Diagnosis not present

## 2019-10-13 ENCOUNTER — Ambulatory Visit (INDEPENDENT_AMBULATORY_CARE_PROVIDER_SITE_OTHER): Payer: Federal, State, Local not specified - PPO | Admitting: Psychiatry

## 2019-10-13 ENCOUNTER — Other Ambulatory Visit: Payer: Self-pay

## 2019-10-13 DIAGNOSIS — F4323 Adjustment disorder with mixed anxiety and depressed mood: Secondary | ICD-10-CM

## 2019-10-13 DIAGNOSIS — Z630 Problems in relationship with spouse or partner: Secondary | ICD-10-CM | POA: Diagnosis not present

## 2019-10-13 DIAGNOSIS — E538 Deficiency of other specified B group vitamins: Secondary | ICD-10-CM

## 2019-10-13 DIAGNOSIS — G4710 Hypersomnia, unspecified: Secondary | ICD-10-CM
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
## 2019-10-26 ENCOUNTER — Other Ambulatory Visit: Payer: Self-pay

## 2019-10-26 ENCOUNTER — Ambulatory Visit (INDEPENDENT_AMBULATORY_CARE_PROVIDER_SITE_OTHER): Payer: Federal, State, Local not specified - PPO | Admitting: Psychiatry

## 2019-10-26 DIAGNOSIS — Z630 Problems in relationship with spouse or partner: Secondary | ICD-10-CM

## 2019-10-26 DIAGNOSIS — F4323 Adjustment disorder with mixed anxiety and depressed mood: Secondary | ICD-10-CM

## 2019-10-26 DIAGNOSIS — E538 Deficiency of other specified B group vitamins: Secondary | ICD-10-CM | POA: Diagnosis not present

## 2019-10-26 DIAGNOSIS — G4710 Hypersomnia, unspecified: Secondary | ICD-10-CM | POA: Diagnosis not present

## 2019-10-26 NOTE — Progress Notes (Signed)
Psychotherapy Progress Note [facility] Marliss Czar, PhD LP  Patient ID: Barbara Mason     MRN: [id number] Therapy format: Individual psychotherapy Date: 10/26/2019      Start: 8:14a     Stop: 8:59a     Time Spent: 45 min Location: In-person   Session narrative (presenting needs, interim history, self-report of stressors and symptoms, applications of prior therapy, status changes, and interventions made in session) Tried to bring up needs with [name] this weekend but 20 min turned to 4 hrs of experienced harangue, a lot of "one more thing" and monologuing about fault he finds until she was exhausted and he felt better enough to come down from the upper floor, join family dinner, proceed more normally.  Situation still that she is fundamentally unhappy, fundamentally does not believe it is or can be a "normal" marriage, and she will want to separate once son Jean Rosenthal (h.s. junior) is situated.  D [name] (1st yr college) is already convinced her father is not capable of relating as family, would already be supportive of divorce, but reportedly Jean Rosenthal has not had the same negative experience with his father, he just is used to him as often missing, owing to his long assignments away from home and history of work and living in other places including other countries.    Hx of discussing separation has been a lot of "you do it" fed back to her.  PT understands H to want to preserve his image as not-the-bad-guy by not acting on separation and telling her if it's what she wants, she should do it.  Discussed presumed impact on Jean Rosenthal if she were to go ahead with it and further tactics for communicating with husband, particularly for neutralizing dramatic, exaggerated, blaming, or otherwise unfair fighting.  Took up the possibility of calling out tactics like "you do it" as making it sound like he wants it, too but wants plausible deniability for himself.  Other possibilities for just  acknowledging she is unhappy but is not sure what to do, just let her own refusal to reassure speak that things are that serious.  If harangued, emphasized that it is OK to exit the conversation, even if it means walking out of the house, so long as she gives fair warning and says it's what she'll do if he doesn't stop.  Articulated adult rights both to express oneself and to decide when to enter/exit a conversation, the courtesy being notice, like rules of the road, e.g., unmarked intersections -- somebody's turn to go/stop, and signals help.  Challenged to consider what it would take, or what sort of miracle might be a mind-changer for her about wanting out, what improvement, however unlikely, might look like, should [name] undertake it.  Suggested to settle her own conscience about having tried everything, it may the one thing she actually owes the discussion -- a better description of what she could accept.    Therapeutic modalities: Cognitive Behavioral Therapy, Solution-Oriented/Positive Psychology and Assertiveness/Communication  Mental Status/Observations:  Appearance:   Casual and Neat     Behavior:  Appropriate, less hesitant  Motor:  Normal  Speech/Language:   Clear and Coherent  Affect:  Appropriate  Mood:  anxious and dysthymic  Thought process:  normal  Thought content:    WNL  Sensory/Perceptual disturbances:    WNL  Orientation:  Fully oriented  Attention:  Good  Concentration:  Good  Memory:  WNL  Insight:    Good  Judgment:   Good  Impulse Control:  Good   Risk Assessment: Danger to Self: No Self-injurious Behavior: No Danger to Others: No Physical Aggression / Violence: No Duty to Warn: No Access to Firearms a concern: No  Assessment of progress:  progressing  Diagnosis:   ICD-10-CM   1. Adjustment disorder with mixed anxiety and depressed mood  F43.23   2. Relationship problem between partners  Z63.0   3. B12 deficiency  E53.8   4. Hypersomnia  G47.10     Plan:  . Try out communication recommendations at discretion . Other recommendations/advice as may be noted above . Continue to utilize previously learned skills ad lib . Maintain medication as prescribed and work faithfully with relevant prescriber(s) if any changes are desired or seem indicated . Call the clinic on-call service, present to ER, or call 911 if any life-threatening psychiatric crisis . Return for session(s) already scheduled.Marland Kitchen  Next scheduled visit in this office 11/04/2019.  Blanchie Serve, PhD Luan Moore, PhD LP Clinical Psychologist, [facility] [address] [place], [place] [address] [phone]

## 2019-11-04 ENCOUNTER — Ambulatory Visit (INDEPENDENT_AMBULATORY_CARE_PROVIDER_SITE_OTHER): Payer: Federal, State, Local not specified - PPO | Admitting: Psychiatry

## 2019-11-04 DIAGNOSIS — E538 Deficiency of other specified B group vitamins: Secondary | ICD-10-CM | POA: Diagnosis not present

## 2019-11-04 DIAGNOSIS — Z630 Problems in relationship with spouse or partner: Secondary | ICD-10-CM | POA: Diagnosis not present

## 2019-11-04 DIAGNOSIS — G4710 Hypersomnia, unspecified: Secondary | ICD-10-CM

## 2019-11-04 DIAGNOSIS — R519 Headache, unspecified: Secondary | ICD-10-CM

## 2019-11-04 DIAGNOSIS — F4323 Adjustment disorder with mixed anxiety and depressed mood: Secondary | ICD-10-CM | POA: Diagnosis not present
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
## 2019-11-07 DIAGNOSIS — Z1231 Encounter for screening mammogram for malignant neoplasm of breast: Secondary | ICD-10-CM | POA: Diagnosis not present

## 2019-11-07 DIAGNOSIS — Z01419 Encounter for gynecological examination (general) (routine) without abnormal findings: Secondary | ICD-10-CM | POA: Diagnosis not present

## 2019-11-07 DIAGNOSIS — R61 Generalized hyperhidrosis: Secondary | ICD-10-CM | POA: Diagnosis not present

## 2019-11-18 ENCOUNTER — Ambulatory Visit (INDEPENDENT_AMBULATORY_CARE_PROVIDER_SITE_OTHER): Payer: Federal, State, Local not specified - PPO | Admitting: Psychiatry

## 2019-11-18 ENCOUNTER — Other Ambulatory Visit: Payer: Self-pay

## 2019-11-18 DIAGNOSIS — G4710 Hypersomnia, unspecified: Secondary | ICD-10-CM

## 2019-11-18 DIAGNOSIS — F4323 Adjustment disorder with mixed anxiety and depressed mood: Secondary | ICD-10-CM

## 2019-11-18 DIAGNOSIS — Z630 Problems in relationship with spouse or partner: Secondary | ICD-10-CM | POA: Diagnosis not present
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
## 2019-11-22 ENCOUNTER — Telehealth: Payer: Self-pay | Admitting: Family Medicine

## 2019-11-22 ENCOUNTER — Encounter (INDEPENDENT_AMBULATORY_CARE_PROVIDER_SITE_OTHER): Payer: Self-pay | Admitting: Family Medicine

## 2019-11-22 NOTE — Telephone Encounter (Signed)
MEDICATION: Vitamin B12 & Syringe/Needle 91 G  PH[facility]Y: [facility] [address] [place]  Comments:   **Let patient know to contact pharmacy at the end of the day to make sure medication is ready. **  ** Please notify patient to allow 48-72 hours to process**  **Encourage patient to contact the pharmacy for refills or they can request refills through [facility]**

## 2019-11-23 ENCOUNTER — Other Ambulatory Visit: Payer: Self-pay

## 2019-11-23 MED ORDER — "SYRINGE 25G X 1"" 3 ML MISC": 1.0000 "application " | 1 refills | Status: DC

## 2019-11-23 MED ORDER — CYANOCOBALAMIN 1000 MCG/ML IJ SOLN: INTRAMUSCULAR | 1 refills | Status: DC

## 2019-11-23 NOTE — Telephone Encounter (Signed)
Please review

## 2019-12-03 NOTE — Telephone Encounter (Signed)
Sent in by Dr. Artis Flock

## 2019-12-05 DIAGNOSIS — L723 Sebaceous cyst: Secondary | ICD-10-CM | POA: Diagnosis not present

## 2019-12-13 DIAGNOSIS — L811 Chloasma: Secondary | ICD-10-CM | POA: Diagnosis not present

## 2019-12-13 DIAGNOSIS — D225 Melanocytic nevi of trunk: Secondary | ICD-10-CM | POA: Diagnosis not present

## 2019-12-13 DIAGNOSIS — L821 Other seborrheic keratosis: Secondary | ICD-10-CM | POA: Diagnosis not present

## 2019-12-13 DIAGNOSIS — D2362 Other benign neoplasm of skin of left upper limb, including shoulder: Secondary | ICD-10-CM | POA: Diagnosis not present

## 2019-12-21 ENCOUNTER — Ambulatory Visit: Payer: Federal, State, Local not specified - PPO | Admitting: Psychiatry

## 2020-01-06 ENCOUNTER — Ambulatory Visit: Payer: Federal, State, Local not specified - PPO | Admitting: Psychiatry

## 2020-01-17 ENCOUNTER — Ambulatory Visit: Payer: Federal, State, Local not specified - PPO | Admitting: Psychiatry

## 2020-01-18 DIAGNOSIS — L723 Sebaceous cyst: Secondary | ICD-10-CM | POA: Diagnosis not present

## 2020-01-19 ENCOUNTER — Other Ambulatory Visit: Payer: Self-pay

## 2020-01-19 ENCOUNTER — Ambulatory Visit (INDEPENDENT_AMBULATORY_CARE_PROVIDER_SITE_OTHER): Payer: Federal, State, Local not specified - PPO | Admitting: Family Medicine

## 2020-01-19 ENCOUNTER — Encounter: Payer: Self-pay | Admitting: Family Medicine

## 2020-01-19 VITALS — BP 98/60 | HR 78 | Temp 98.2°F | Ht 68.0 in | Wt 133.6 lb

## 2020-01-19 DIAGNOSIS — G4710 Hypersomnia, unspecified: Secondary | ICD-10-CM | POA: Diagnosis not present

## 2020-01-19 DIAGNOSIS — G4719 Other hypersomnia: Secondary | ICD-10-CM | POA: Diagnosis not present

## 2020-01-19 DIAGNOSIS — E538 Deficiency of other specified B group vitamins: Secondary | ICD-10-CM

## 2020-01-19 DIAGNOSIS — Z0000 Encounter for general adult medical examination without abnormal findings: Secondary | ICD-10-CM | POA: Diagnosis not present

## 2020-01-19 LAB — CBC WITH DIFFERENTIAL/PLATELET
Basophils Absolute: 0 10*3/uL (ref 0.0–0.1)
Basophils Relative: 0.8 % (ref 0.0–3.0)
Eosinophils Absolute: 0.1 10*3/uL (ref 0.0–0.7)
Eosinophils Relative: 1.4 % (ref 0.0–5.0)
HCT: 39.4 % (ref 36.0–46.0)
Hemoglobin: 13.4 g/dL (ref 12.0–15.0)
Lymphocytes Relative: 32.7 % (ref 12.0–46.0)
Lymphs Abs: 1.5 10*3/uL (ref 0.7–4.0)
MCHC: 34.1 g/dL (ref 30.0–36.0)
MCV: 91.7 fl (ref 78.0–100.0)
Monocytes Absolute: 0.3 10*3/uL (ref 0.1–1.0)
Monocytes Relative: 6.1 % (ref 3.0–12.0)
Neutro Abs: 2.7 10*3/uL (ref 1.4–7.7)
Neutrophils Relative %: 59 % (ref 43.0–77.0)
Platelets: 239 10*3/uL (ref 150.0–400.0)
RBC: 4.3 Mil/uL (ref 3.87–5.11)
RDW: 12.7 % (ref 11.5–15.5)
WBC: 4.7 10*3/uL (ref 4.0–10.5)

## 2020-01-19 LAB — COMPREHENSIVE METABOLIC PANEL
ALT: 13 U/L (ref 0–35)
AST: 18 U/L (ref 0–37)
Albumin: 4.5 g/dL (ref 3.5–5.2)
Alkaline Phosphatase: 51 U/L (ref 39–117)
BUN: 8 mg/dL (ref 6–23)
CO2: 30 mEq/L (ref 19–32)
Calcium: 9.4 mg/dL (ref 8.4–10.5)
Chloride: 102 mEq/L (ref 96–112)
Creatinine, Ser: 0.82 mg/dL (ref 0.40–1.20)
GFR: 76.68 mL/min (ref >60.00)
Glucose, Bld: 81 mg/dL (ref 70–99)
Potassium: 4.9 mEq/L (ref 3.5–5.1)
Sodium: 139 mEq/L (ref 135–145)
Total Bilirubin: 0.6 mg/dL (ref 0.2–1.2)
Total Protein: 6.5 g/dL (ref 6.0–8.3)

## 2020-01-19 LAB — TSH: TSH: 1.38 u[IU]/mL (ref 0.35–4.50)

## 2020-01-19 LAB — LIPID PANEL
Cholesterol: 152 mg/dL (ref 0–200)
HDL: 81 mg/dL (ref >39.00)
LDL Cholesterol: 60 mg/dL (ref 0–99)
NonHDL: 70.71
Total CHOL/HDL Ratio: 2
Triglycerides: 53 mg/dL (ref 0.0–149.0)
VLDL: 10.6 mg/dL (ref 0.0–40.0)

## 2020-01-19 LAB — VITAMIN B12: Vitamin B-12: 464 pg/mL (ref 211–911)

## 2020-01-19 LAB — VITAMIN D 25 HYDROXY (VIT D DEFICIENCY, FRACTURES): VITD: 45.15 ng/mL (ref 30.00–100.00)

## 2020-01-19 MED ORDER — CYANOCOBALAMIN 1000 MCG/ML IJ SOLN: INTRAMUSCULAR | 3 refills | Status: DC

## 2020-01-19 MED ORDER — "SYRINGE 25G X 1"" 3 ML MISC": 1.0000 "application " | 1 refills | Status: DC

## 2020-01-19 NOTE — Progress Notes (Signed)
Patient: Barbara Mason MRN: [id number] DOB: 12/23/1980 PCP: Orland Mustard, MD     Subjective:  Chief Complaint  Patient presents with  . Annual Exam    Establish care  . b12 deficiency  . excessive daytime sleepiness    HPI: The patient is a 39 y.o. female who presents today for annual exam. She denies any changes to past medical history. There have been no recent hospitalizations. They are following a well balanced diet and exercise plan. She runs and does yoga 5-6 times weekly. Weight has been stable. No complaints today.  She is a vegan x 5 years.   No first degree relative with colon or breast. Maternal and paternal grandmothers had breast cancer.   b12 deficiency She is on injections and she does it every 2 weeks. Needs levels checked today.   Hypersomnia -she has never had a sleep study. She gets up to urinate through the night at least once, sometimes three times a night. She doesn't really dream. She states she could take a nap at any point in the day, but doesn't fall asleep driving or fall asleep during the day. She has to actual lay down to fall asleep. She works at [employer] and has no problems at work. No headaches and she doesn't snore at night. PCP put her on adderall 20mg  IR that she takes in the AM only. She doesn't take rest of day. She states all of this started when her husband was in for a year, daughter started college in [place] and she had some other stressors. She doesn't feel like she has recovered from this.   Immunization History  Administered Date(s) Administered  . Tdap 09/01/2012   Colonoscopy: routine screening. Age 2 years.  Mammogram: 10/2019. Normal  Pap smear: 01/11/2018   Review of Systems  Constitutional: Positive for fatigue. Negative for chills and fever.  HENT: Negative for dental problem, ear pain, hearing loss and trouble swallowing.   Eyes: Negative for visual disturbance.  Respiratory: Negative for cough, chest  tightness, shortness of breath and wheezing.   Cardiovascular: Negative for chest pain, palpitations and leg swelling.  Gastrointestinal: Negative for abdominal pain, blood in stool, diarrhea, nausea and vomiting.  Endocrine: Negative for cold intolerance, polydipsia, polyphagia and polyuria.  Genitourinary: Negative for dysuria, flank pain, frequency, hematuria and pelvic pain.  Musculoskeletal: Negative for arthralgias.  Skin: Negative for rash.  Neurological: Negative for dizziness, light-headedness and headaches.  Psychiatric/Behavioral: Negative for dysphoric mood and sleep disturbance. The patient is not nervous/anxious.     Allergies Patient has No Known Allergies.  Past Medical History Patient  has a past medical history of Anxiety.  Surgical History Patient  has a past surgical history that includes Wisdom tooth extraction (1997).  Family History Pateint's family history includes Cancer in her maternal grandmother and paternal grandmother; Diabetes in her maternal grandmother; Heart disease in her father; High Cholesterol in her father, maternal grandmother, and paternal grandmother; Hypertension in her sister.  Social History Patient  reports that she has never smoked. She has never used smokeless tobacco. She reports current alcohol use of about 1.0 standard drink of alcohol per week. She reports that she does not use drugs.    Objective: Vitals:   01/19/20 1114  BP: 98/60  Pulse: 78  Temp: 98.2 F (36.8 C)  TempSrc: Temporal  SpO2: 99%  Weight: 133 lb 9.6 oz (60.6 kg)  Height: 5\' 8"  (1.727 m)    Body mass index is 20.31 kg/m.  Physical  Exam Vitals reviewed.  Constitutional:      Appearance: Normal appearance. She is well-developed and normal weight.  HENT:     Head: Normocephalic and atraumatic.     Right Ear: Tympanic membrane, ear canal and external ear normal.     Left Ear: Tympanic membrane, ear canal and external ear normal.  Eyes:     Extraocular  Movements: Extraocular movements intact.     Conjunctiva/sclera: Conjunctivae normal.     Pupils: Pupils are equal, round, and reactive to light.  Neck:     Thyroid: No thyromegaly.  Cardiovascular:     Rate and Rhythm: Normal rate and regular rhythm.     Pulses: Normal pulses.     Heart sounds: Normal heart sounds. No murmur heard.   Pulmonary:     Effort: Pulmonary effort is normal.     Breath sounds: Normal breath sounds.  Abdominal:     General: Abdomen is flat. Bowel sounds are normal. There is no distension.     Palpations: Abdomen is soft.     Tenderness: There is no abdominal tenderness.  Musculoskeletal:     Cervical back: Normal range of motion and neck supple.  Lymphadenopathy:     Cervical: No cervical adenopathy.  Skin:    General: Skin is warm and dry.     Capillary Refill: Capillary refill takes less than 2 seconds.     Findings: No rash.  Neurological:     General: No focal deficit present.     Mental Status: She is alert and oriented to person, place, and time.     Cranial Nerves: No cranial nerve deficit.     Coordination: Coordination normal.     Deep Tendon Reflexes: Reflexes normal.  Psychiatric:        Mood and Affect: Mood normal.        Behavior: Behavior normal.      Office Visit from 01/19/2020 in [place]  PHQ-2 Total Score 0         Assessment/plan: 1. Annual physical exam Extremely healthy. HM reviewed and updated. Need records from GYN. Continue running, yoga. Routine labs today. F/u in one year or as needed.  Counseling  - CBC with Differential/Platelet - Comprehensive metabolic panel - VITAMIN D 25 Hydroxy (Vit-D Deficiency, Fractures) - TSH - Lipid panel  2. B12 deficiency  - Vitamin B12  3. Excessive daytime sleepiness Discussed with her that I would not put her on adderall without a work up or for just complaints of fatigue.  Start with sleep study and referral placed to pulm. Will likely take her  off of this.  - Ambulatory referral to Pulmonology    This visit occurred during the SARS-CoV-2 public health emergency.  Safety protocols were in place, including screening questions prior to the visit, additional usage of staff PPE, and extensive cleaning of exam room while observing appropriate contact time as indicated for disinfecting solutions.     Return in about 1 year (around 01/18/2021).     Orma Flaming, MD [place]  01/19/2020

## 2020-01-19 NOTE — Patient Instructions (Addendum)
Would like you to get a sleep study to see why you are so tired. Excessive daytime sleepiness is medical term.   So nice to meet you!  Dr. Rogers Blocker   Preventive Care 64-41 Years Old, Female Preventive care refers to visits with your health care provider and lifestyle choices that can promote health and wellness. This includes:  A yearly physical exam. This may also be called an annual well check.  Regular dental visits and eye exams.  Immunizations.  Screening for certain conditions.  Healthy lifestyle choices, such as eating a healthy diet, getting regular exercise, not using drugs or products that contain nicotine and tobacco, and limiting alcohol use. What can I expect for my preventive care visit? Physical exam Your health care provider will check your:  Height and weight. This may be used to calculate body mass index (BMI), which tells if you are at a healthy weight.  Heart rate and blood pressure.  Skin for abnormal spots. Counseling Your health care provider may ask you questions about your:  Alcohol, tobacco, and drug use.  Emotional well-being.  Home and relationship well-being.  Sexual activity.  Eating habits.  Work and work Statistician.  Method of birth control.  Menstrual cycle.  Pregnancy history. What immunizations do I need?  Influenza (flu) vaccine  This is recommended every year. Tetanus, diphtheria, and pertussis (Tdap) vaccine  You may need a Td booster every 10 years. Varicella (chickenpox) vaccine  You may need this if you have not been vaccinated. Zoster (shingles) vaccine  You may need this after age 89. Measles, mumps, and rubella (MMR) vaccine  You may need at least one dose of MMR if you were born in 1957 or later. You may also need a second dose. Pneumococcal conjugate (PCV13) vaccine  You may need this if you have certain conditions and were not previously vaccinated. Pneumococcal polysaccharide (PPSV23) vaccine  You may  need one or two doses if you smoke cigarettes or if you have certain conditions. Meningococcal conjugate (MenACWY) vaccine  You may need this if you have certain conditions. Hepatitis A vaccine  You may need this if you have certain conditions or if you travel or work in places where you may be exposed to hepatitis A. Hepatitis B vaccine  You may need this if you have certain conditions or if you travel or work in places where you may be exposed to hepatitis B. Haemophilus influenzae type b (Hib) vaccine  You may need this if you have certain conditions. Human papillomavirus (HPV) vaccine  If recommended by your health care provider, you may need three doses over 6 months. You may receive vaccines as individual doses or as more than one vaccine together in one shot (combination vaccines). Talk with your health care provider about the risks and benefits of combination vaccines. What tests do I need? Blood tests  Lipid and cholesterol levels. These may be checked every 5 years, or more frequently if you are over 9 years old.  Hepatitis C test.  Hepatitis B test. Screening  Lung cancer screening. You may have this screening every year starting at age 80 if you have a 30-pack-year history of smoking and currently smoke or have quit within the past 15 years.  Colorectal cancer screening. All adults should have this screening starting at age 67 and continuing until age 36. Your health care provider may recommend screening at age 22 if you are at increased risk. You will have tests every 1-10 years, depending on  your results and the type of screening test.  Diabetes screening. This is done by checking your blood sugar (glucose) after you have not eaten for a while (fasting). You may have this done every 1-3 years.  Mammogram. This may be done every 1-2 years. Talk with your health care provider about when you should start having regular mammograms. This may depend on whether you have a  family history of breast cancer.  BRCA-related cancer screening. This may be done if you have a family history of breast, ovarian, tubal, or peritoneal cancers.  Pelvic exam and Pap test. This may be done every 3 years starting at age 26. Starting at age 21, this may be done every 5 years if you have a Pap test in combination with an HPV test. Other tests  Sexually transmitted disease (STD) testing.  Bone density scan. This is done to screen for osteoporosis. You may have this scan if you are at high risk for osteoporosis. Follow these instructions at home: Eating and drinking  Eat a diet that includes fresh fruits and vegetables, whole grains, lean protein, and low-fat dairy.  Take vitamin and mineral supplements as recommended by your health care provider.  Do not drink alcohol if: ? Your health care provider tells you not to drink. ? You are pregnant, may be pregnant, or are planning to become pregnant.  If you drink alcohol: ? Limit how much you have to 0-1 drink a day. ? Be aware of how much alcohol is in your drink. In the [place] one drink equals one 12 oz bottle of beer (355 mL), one 5 oz glass of wine (148 mL), or one 1 oz glass of hard liquor (44 mL). Lifestyle  Take daily care of your teeth and gums.  Stay active. Exercise for at least 30 minutes on 5 or more days each week.  Do not use any products that contain nicotine or tobacco, such as cigarettes, e-cigarettes, and chewing tobacco. If you need help quitting, ask your health care provider.  If you are sexually active, practice safe sex. Use a condom or other form of birth control (contraception) in order to prevent pregnancy and STIs (sexually transmitted infections).  If told by your health care provider, take low-dose aspirin daily starting at age 75. What's next?  Visit your health care provider once a year for a well check visit.  Ask your health care provider how often you should have your eyes and teeth  checked.  Stay up to date on all vaccines. This information is not intended to replace advice given to you by your health care provider. Make sure you discuss any questions you have with your health care provider. Document Revised: 04/08/2018 Document Reviewed: 04/08/2018 Elsevier Patient Education  2020 [employer].

## 2020-01-25 DIAGNOSIS — F411 Generalized anxiety disorder: Secondary | ICD-10-CM | POA: Diagnosis not present

## 2020-01-31 ENCOUNTER — Ambulatory Visit: Payer: Federal, State, Local not specified - PPO | Admitting: Psychiatry

## 2020-02-06 ENCOUNTER — Other Ambulatory Visit: Payer: Self-pay | Admitting: Family Medicine

## 2020-02-06 DIAGNOSIS — G4710 Hypersomnia, unspecified: Secondary | ICD-10-CM

## 2020-02-06 DIAGNOSIS — F411 Generalized anxiety disorder: Secondary | ICD-10-CM | POA: Diagnosis not present

## 2020-02-06 MED ORDER — AMPHETAMINE-DEXTROAMPHETAMINE 20 MG PO TABS: 20.0000 mg | ORAL_TABLET | Freq: Three times a day (TID) | ORAL | 0 refills | Status: DC | PRN

## 2020-02-06 NOTE — Telephone Encounter (Signed)
[facility] Database Verified LR: 08-20-2019 Qty: 90 Last office visit: 01-19-2020 Upcoming appointment: No pending appt

## 2020-02-15 DIAGNOSIS — F411 Generalized anxiety disorder: Secondary | ICD-10-CM | POA: Diagnosis not present

## 2020-02-21 DIAGNOSIS — F411 Generalized anxiety disorder: Secondary | ICD-10-CM | POA: Diagnosis not present

## 2020-03-12 DIAGNOSIS — F411 Generalized anxiety disorder: Secondary | ICD-10-CM | POA: Diagnosis not present

## 2020-03-19 DIAGNOSIS — F4320 Adjustment disorder, unspecified: Secondary | ICD-10-CM | POA: Diagnosis not present

## 2020-03-23 DIAGNOSIS — F4320 Adjustment disorder, unspecified: Secondary | ICD-10-CM | POA: Diagnosis not present

## 2020-04-03 DIAGNOSIS — Z20822 Contact with and (suspected) exposure to covid-19: Secondary | ICD-10-CM | POA: Diagnosis not present

## 2020-04-04 DIAGNOSIS — F4320 Adjustment disorder, unspecified: Secondary | ICD-10-CM | POA: Diagnosis not present

## 2020-04-11 DIAGNOSIS — F4320 Adjustment disorder, unspecified: Secondary | ICD-10-CM | POA: Diagnosis not present

## 2020-04-25 DIAGNOSIS — F4320 Adjustment disorder, unspecified: Secondary | ICD-10-CM | POA: Diagnosis not present

## 2020-05-02 ENCOUNTER — Encounter: Payer: Self-pay | Admitting: Family Medicine

## 2020-05-03 ENCOUNTER — Other Ambulatory Visit: Payer: Self-pay

## 2020-05-03 DIAGNOSIS — Z20822 Contact with and (suspected) exposure to covid-19: Secondary | ICD-10-CM | POA: Diagnosis not present

## 2020-05-03 MED ORDER — CYANOCOBALAMIN 1000 MCG/ML IJ SOLN: INTRAMUSCULAR | 3 refills | Status: DC

## 2020-05-15 DIAGNOSIS — F4320 Adjustment disorder, unspecified: Secondary | ICD-10-CM | POA: Diagnosis not present

## 2020-05-22 DIAGNOSIS — F4320 Adjustment disorder, unspecified: Secondary | ICD-10-CM | POA: Diagnosis not present

## 2020-05-29 DIAGNOSIS — F4320 Adjustment disorder, unspecified: Secondary | ICD-10-CM | POA: Diagnosis not present

## 2020-06-05 DIAGNOSIS — F4320 Adjustment disorder, unspecified: Secondary | ICD-10-CM | POA: Diagnosis not present

## 2020-06-11 DIAGNOSIS — F4320 Adjustment disorder, unspecified: Secondary | ICD-10-CM | POA: Diagnosis not present

## 2020-06-14 ENCOUNTER — Other Ambulatory Visit: Payer: Self-pay | Admitting: Family Medicine

## 2020-06-14 DIAGNOSIS — G4710 Hypersomnia, unspecified: Secondary | ICD-10-CM

## 2020-06-14 MED ORDER — AMPHETAMINE-DEXTROAMPHETAMINE 20 MG PO TABS: 20.0000 mg | ORAL_TABLET | Freq: Two times a day (BID) | ORAL | 0 refills | Status: DC

## 2020-06-14 NOTE — Telephone Encounter (Signed)
LAST APPOINTMENT DATE: 01/19/2020   NEXT APPOINTMENT DATE: Visit date not found    LAST REFILL: [id number]  QTY: 90

## 2020-06-19 DIAGNOSIS — F4320 Adjustment disorder, unspecified: Secondary | ICD-10-CM | POA: Diagnosis not present

## 2020-06-26 DIAGNOSIS — F4320 Adjustment disorder, unspecified: Secondary | ICD-10-CM | POA: Diagnosis not present

## 2020-07-03 DIAGNOSIS — F4320 Adjustment disorder, unspecified: Secondary | ICD-10-CM | POA: Diagnosis not present

## 2020-07-12 ENCOUNTER — Encounter: Payer: Self-pay | Admitting: Family Medicine

## 2020-07-13 ENCOUNTER — Institutional Professional Consult (permissible substitution): Payer: Federal, State, Local not specified - PPO | Admitting: Pulmonary Disease

## 2020-07-13 NOTE — Telephone Encounter (Signed)
Please schedule appointment with the pt.  Thank You! 

## 2020-07-17 DIAGNOSIS — F4320 Adjustment disorder, unspecified: Secondary | ICD-10-CM | POA: Diagnosis not present

## 2020-07-18 ENCOUNTER — Encounter: Payer: Self-pay | Admitting: Family Medicine

## 2020-07-18 ENCOUNTER — Ambulatory Visit: Payer: Federal, State, Local not specified - PPO | Admitting: Family Medicine

## 2020-07-18 ENCOUNTER — Other Ambulatory Visit: Payer: Self-pay

## 2020-07-18 VITALS — BP 109/73 | HR 72 | Temp 98.1°F | Ht 68.0 in | Wt 135.6 lb

## 2020-07-18 DIAGNOSIS — L729 Follicular cyst of the skin and subcutaneous tissue, unspecified: Secondary | ICD-10-CM

## 2020-07-18 NOTE — Patient Instructions (Signed)
Feels like a calcified cyst and benign. Would watch if for 4 weeks and if still there we can ultrasound. Just send me an email.   email me in a month!   Merry christmas! alli

## 2020-07-18 NOTE — Progress Notes (Signed)
Patient: Barbara Mason MRN: [id number] DOB: 09-01-1980 PCP: Orland Mustard, MD     Subjective:  Chief Complaint  Patient presents with  . Mass    behind left ear.    HPI: The patient is a 39 y.o. female who presents today for a bump that she noticed behind her left ear 3 weeks ago. She denies any pain. It has not grown and it is not tender to touch. She feels like she can move it. Denies any fever/chills or URI symptoms. She denies any easy bruising, night fevers, weight loss or other adenopathy.  She has not used ice/heat compresses.   Review of Systems  Constitutional: Negative for chills and fever.  Skin:       Mass behind left ear  Neurological: Negative for dizziness and headaches.  Hematological: Negative for adenopathy. Does not bruise/bleed easily.    Allergies Patient has No Known Allergies.  Past Medical History Patient  has a past medical history of Anxiety.  Surgical History Patient  has a past surgical history that includes Wisdom tooth extraction (1997).  Family History Pateint's family history includes Cancer in her maternal grandmother and paternal grandmother; Diabetes in her maternal grandmother; Heart disease in her father; High Cholesterol in her father, maternal grandmother, and paternal grandmother; Hypertension in her sister.  Social History Patient  reports that she has never smoked. She has never used smokeless tobacco. She reports current alcohol use of about 1.0 standard drink of alcohol per week. She reports that she does not use drugs.    Objective: Vitals:   07/18/20 0901  BP: 109/73  Pulse: 72  Temp: 98.1 F (36.7 C)  TempSrc: Temporal  SpO2: 100%  Weight: 135 lb 9.6 oz (61.5 kg)  Height: 5\' 8"  (1.727 m)    Body mass index is 20.62 kg/m.  Physical Exam Vitals reviewed.  Constitutional:      Appearance: She is normal weight.  HENT:     Head: Normocephalic and atraumatic.     Comments: Left ear: post auricular she has a  very small (about 2-68mm) moveable hard mass.  Cardiovascular:     Rate and Rhythm: Normal rate and regular rhythm.     Heart sounds: Normal heart sounds.  Pulmonary:     Effort: Pulmonary effort is normal.     Breath sounds: Normal breath sounds.  Abdominal:     General: Bowel sounds are normal.     Palpations: Abdomen is soft.  Musculoskeletal:     Cervical back: Normal range of motion and neck supple. No rigidity.  Lymphadenopathy:     Cervical: No cervical adenopathy.  Skin:    General: Skin is warm.     Capillary Refill: Capillary refill takes less than 2 seconds.  Neurological:     General: No focal deficit present.     Mental Status: She is alert and oriented to person, place, and time.  Psychiatric:        Mood and Affect: Mood normal.        Behavior: Behavior normal.        Assessment/plan: 1. Skin cyst Calcification? Appears benign. She is to let me know in 4 weeks if it has grown/changed/etc. Not out of question to ultrasound.     This visit occurred during the SARS-CoV-2 public health emergency.  Safety protocols were in place, including screening questions prior to the visit, additional usage of staff PPE, and extensive cleaning of exam room while observing appropriate contact time as indicated for disinfecting  solutions.     Return if symptoms worsen or fail to improve.    Orland Mustard, MD [facility]   07/18/2020

## 2020-07-24 DIAGNOSIS — F4320 Adjustment disorder, unspecified: Secondary | ICD-10-CM | POA: Diagnosis not present

## 2020-08-14 ENCOUNTER — Encounter: Payer: Self-pay | Admitting: Family Medicine

## 2020-08-14 DIAGNOSIS — L729 Follicular cyst of the skin and subcutaneous tissue, unspecified: Secondary | ICD-10-CM

## 2020-08-21 DIAGNOSIS — F4320 Adjustment disorder, unspecified: Secondary | ICD-10-CM | POA: Diagnosis not present

## 2020-08-31 ENCOUNTER — Ambulatory Visit
Admission: RE | Admit: 2020-08-31 | Discharge: 2020-08-31 | Disposition: A | Discharge status: Home / Self Care | Payer: Federal, State, Local not specified - PPO | Source: Ambulatory Visit | Attending: Family Medicine | Admitting: Family Medicine

## 2020-08-31 DIAGNOSIS — L729 Follicular cyst of the skin and subcutaneous tissue, unspecified: Secondary | ICD-10-CM

## 2020-08-31 DIAGNOSIS — R220 Localized swelling, mass and lump, head: Secondary | ICD-10-CM | POA: Diagnosis not present

## 2020-09-05 ENCOUNTER — Other Ambulatory Visit: Payer: Self-pay | Admitting: Family Medicine

## 2020-09-05 DIAGNOSIS — L729 Follicular cyst of the skin and subcutaneous tissue, unspecified: Secondary | ICD-10-CM

## 2020-09-06 DIAGNOSIS — F4320 Adjustment disorder, unspecified: Secondary | ICD-10-CM | POA: Diagnosis not present

## 2020-09-09 DIAGNOSIS — F4322 Adjustment disorder with anxiety: Secondary | ICD-10-CM | POA: Diagnosis not present

## 2020-09-11 DIAGNOSIS — F4320 Adjustment disorder, unspecified: Secondary | ICD-10-CM | POA: Diagnosis not present

## 2020-09-18 ENCOUNTER — Other Ambulatory Visit: Payer: Self-pay | Admitting: Family Medicine

## 2020-09-18 DIAGNOSIS — G4710 Hypersomnia, unspecified: Secondary | ICD-10-CM

## 2020-09-18 MED ORDER — AMPHETAMINE-DEXTROAMPHETAMINE 20 MG PO TABS: 20.0000 mg | ORAL_TABLET | Freq: Two times a day (BID) | ORAL | 0 refills | Status: DC

## 2020-09-18 NOTE — Telephone Encounter (Signed)
Pt requesting Adderall 20 mg tab LOV: 07/18/2020 No future visit scheduled   Approve?

## 2020-09-23 DIAGNOSIS — F4322 Adjustment disorder with anxiety: Secondary | ICD-10-CM | POA: Diagnosis not present

## 2020-09-25 DIAGNOSIS — F4320 Adjustment disorder, unspecified: Secondary | ICD-10-CM | POA: Diagnosis not present

## 2020-09-27 DIAGNOSIS — L988 Other specified disorders of the skin and subcutaneous tissue: Secondary | ICD-10-CM | POA: Diagnosis not present

## 2020-10-01 DIAGNOSIS — F4320 Adjustment disorder, unspecified: Secondary | ICD-10-CM | POA: Diagnosis not present

## 2020-10-12 DIAGNOSIS — L821 Other seborrheic keratosis: Secondary | ICD-10-CM | POA: Diagnosis not present

## 2020-10-12 DIAGNOSIS — L700 Acne vulgaris: Secondary | ICD-10-CM | POA: Diagnosis not present

## 2020-10-12 DIAGNOSIS — D225 Melanocytic nevi of trunk: Secondary | ICD-10-CM | POA: Diagnosis not present

## 2020-10-12 DIAGNOSIS — L578 Other skin changes due to chronic exposure to nonionizing radiation: Secondary | ICD-10-CM | POA: Diagnosis not present

## 2020-10-14 DIAGNOSIS — F4322 Adjustment disorder with anxiety: Secondary | ICD-10-CM | POA: Diagnosis not present

## 2020-10-24 DIAGNOSIS — F4320 Adjustment disorder, unspecified: Secondary | ICD-10-CM | POA: Diagnosis not present

## 2020-11-16 DIAGNOSIS — F4320 Adjustment disorder, unspecified: Secondary | ICD-10-CM | POA: Diagnosis not present

## 2020-12-04 ENCOUNTER — Other Ambulatory Visit: Payer: Self-pay | Admitting: Family Medicine

## 2020-12-04 DIAGNOSIS — G4710 Hypersomnia, unspecified: Secondary | ICD-10-CM

## 2020-12-05 DIAGNOSIS — F4320 Adjustment disorder, unspecified: Secondary | ICD-10-CM | POA: Diagnosis not present

## 2020-12-05 MED ORDER — AMPHETAMINE-DEXTROAMPHETAMINE 20 MG PO TABS: 20.0000 mg | ORAL_TABLET | Freq: Two times a day (BID) | ORAL | 0 refills | Status: DC

## 2020-12-25 DIAGNOSIS — F4320 Adjustment disorder, unspecified: Secondary | ICD-10-CM | POA: Diagnosis not present

## 2021-01-10 DIAGNOSIS — F4320 Adjustment disorder, unspecified: Secondary | ICD-10-CM | POA: Diagnosis not present

## 2021-01-24 ENCOUNTER — Telehealth: Payer: Self-pay

## 2021-01-24 NOTE — Telephone Encounter (Signed)
Nurse Assessment Nurse: Laurance Flatten, RN, Geni Bers Date/Time (Eastern Time): 01/23/2021 4:10:41 PM Confirm and document reason for call. If symptomatic, describe symptoms. ---Caller stated she tested positive for covid on Sunday. She stated she has a slight cough but no congestion but has a widespread rash that is raised but does not itch. No fever. Does the patient have any new or worsening symptoms? ---Yes Will a triage be completed? ---Yes Related visit to physician within the last 2 weeks? ---No Does the PT have any chronic conditions? (i.e. diabetes, asthma, this includes High risk factors for pregnancy, etc.) ---No Is the patient pregnant or possibly pregnant? (Ask all females between the ages of 66-55) ---No Is this a behavioral health or substance abuse call? ---No Guidelines Guideline Title Affirmed Question Affirmed Notes Nurse Date/Time (Eastern Time) COVID-19 - Diagnosed or Suspected [1] COVID-19 diagnosed by positive lab test (e.g., PCR, rapid self-test kit) AND [2] mild symptoms (e.g., cough, fever, Laurance Flatten, RN, Geni Bers 01/23/2021 4:12:48 PM PLEASE NOTE: All timestamps contained within this report are represented as [place] Standard Time. CONFIDENTIALTY NOTICE: This fax transmission is intended only for the addressee. It contains information that is legally privileged, confidential or otherwise protected from use or disclosure. If you are not the intended recipient, you are strictly prohibited from reviewing, disclosing, copying using or disseminating any of this information or taking any action in reliance on or regarding this information. If you have received this fax in error, please notify us immediately by telephone so that we can arrange for its return to [place]. Phone: [phone], Toll-Free: [phone], Fax: [phone] Page: 2 of 2 Call Id: [id number] Guidelines Guideline Title Affirmed Question Affirmed Notes Nurse Date/Time Eilene Ghazi Time) others) AND [7]  no complications or SOB Disp. Time Eilene Ghazi Time) Disposition Final User 01/23/2021 4:17:49 Lee, RN, Althea Grimmer Disagree/Comply Comply Caller Understands Yes PreDisposition InappropriateToAsk Care Advice Given Per Guideline HOME CARE: * You should be able to treat this at home. REASSURANCE AND EDUCATION - POSITIVE COVID-19 LAB TEST AND MILD SYMPTOMS: * You had a recent lab test for COVID-19 and it came back positive. * A positive result on a PCR or rapid self-test kit is highly accurate for diagnosing COVID-19. It is highly likely that you have COVID-19. * From what you have told me, your symptoms are mild. That is reassuring. * Here's some care advice to help you and to help prevent others from getting sick. GENERAL CARE ADVICE FOR COVID-19 SYMPTOMS: * The symptoms are generally treated the same whether you have COVID-19, influenza or some other respiratory virus. COUGH MEDICINES: * COUGH DROPS: Over-the-counter cough drops can help a lot, especially for mild coughs. They soothe an irritated throat and remove the tickle sensation in the back of the throat. Cough drops are easy to carry with you. * COUGH SYRUP WITH DEXTROMETHORPHAN: An over-the-counter cough syrup can help your cough. The most common cough suppressant in over-the-counter cough medicines is dextromethorphan. CALL BACK IF: * You become worse * Chest pain or difficulty breathing occurs * Feeling dehydrated: Drink extra liquids. If the air in your home is dry, use a humidifier. * Fever over 103 F (39.4 C

## 2021-01-24 NOTE — Telephone Encounter (Signed)
Called patient, states she no longer needs an appointment. She contacted a Armed forces operational officer and they advised her that is normal.

## 2021-01-31 DIAGNOSIS — E559 Vitamin D deficiency, unspecified: Secondary | ICD-10-CM | POA: Diagnosis not present

## 2021-01-31 DIAGNOSIS — Z13228 Encounter for screening for other metabolic disorders: Secondary | ICD-10-CM | POA: Diagnosis not present

## 2021-01-31 DIAGNOSIS — Z1231 Encounter for screening mammogram for malignant neoplasm of breast: Secondary | ICD-10-CM | POA: Diagnosis not present

## 2021-01-31 DIAGNOSIS — Z1321 Encounter for screening for nutritional disorder: Secondary | ICD-10-CM | POA: Diagnosis not present

## 2021-01-31 DIAGNOSIS — Z01419 Encounter for gynecological examination (general) (routine) without abnormal findings: Secondary | ICD-10-CM | POA: Diagnosis not present

## 2021-01-31 DIAGNOSIS — Z1329 Encounter for screening for other suspected endocrine disorder: Secondary | ICD-10-CM | POA: Diagnosis not present

## 2021-01-31 DIAGNOSIS — E538 Deficiency of other specified B group vitamins: Secondary | ICD-10-CM | POA: Diagnosis not present

## 2021-01-31 DIAGNOSIS — Z13220 Encounter for screening for lipoid disorders: Secondary | ICD-10-CM | POA: Diagnosis not present

## 2021-02-01 DIAGNOSIS — F4320 Adjustment disorder, unspecified: Secondary | ICD-10-CM | POA: Diagnosis not present

## 2021-02-10 ENCOUNTER — Other Ambulatory Visit: Payer: Self-pay | Admitting: Family Medicine

## 2021-02-10 DIAGNOSIS — G4710 Hypersomnia, unspecified: Secondary | ICD-10-CM

## 2021-02-12 NOTE — Telephone Encounter (Signed)
Please call pt and schedule an appt with a new provider in order to fill medication Adderall.

## 2021-02-19 ENCOUNTER — Other Ambulatory Visit: Payer: Self-pay | Admitting: Family Medicine

## 2021-02-19 DIAGNOSIS — G4710 Hypersomnia, unspecified: Secondary | ICD-10-CM

## 2021-02-20 ENCOUNTER — Telehealth: Payer: Self-pay

## 2021-02-20 NOTE — Telephone Encounter (Signed)
Please call pt and schedule appt with new PCP in order to get refill.

## 2021-02-20 NOTE — Telephone Encounter (Signed)
Patient is scheduled for 05/22/21

## 2021-02-20 NOTE — Telephone Encounter (Signed)
LVM for patient to call back and schedule a TOC appt.

## 2021-02-20 NOTE — Telephone Encounter (Signed)
Pt requesting refill of Adderall 20 mg. Scheduled to see you Oct.

## 2021-02-20 NOTE — Telephone Encounter (Signed)
There is another message on pt. She needs to schedule with new provider to get medication.

## 2021-02-20 NOTE — Telephone Encounter (Signed)
Patient is scheduled for TOC with Alyssa in October. Can we send in the refill for the Adderall?

## 2021-02-20 NOTE — Telephone Encounter (Signed)
Alyssa this is a duplicate, can you just refuse it. Thanks

## 2021-02-20 NOTE — Telephone Encounter (Signed)
Patient is scheduled for 05/22/21

## 2021-02-20 NOTE — Telephone Encounter (Signed)
Patient called and said that she missed a phone call from this office. She stated that it may be about a prescription refill

## 2021-02-20 NOTE — Telephone Encounter (Signed)
Pt needs to be seen for refill per Alyssa.

## 2021-02-21 NOTE — Telephone Encounter (Signed)
Pt is scheduled on Monday with Alyssa

## 2021-02-25 ENCOUNTER — Other Ambulatory Visit: Payer: Self-pay

## 2021-02-25 ENCOUNTER — Ambulatory Visit: Payer: Federal, State, Local not specified - PPO | Admitting: Physician Assistant

## 2021-02-25 VITALS — BP 102/66 | HR 70 | Temp 97.8°F | Ht 68.0 in | Wt 132.2 lb

## 2021-02-25 DIAGNOSIS — G4710 Hypersomnia, unspecified: Secondary | ICD-10-CM | POA: Diagnosis not present

## 2021-02-25 DIAGNOSIS — E538 Deficiency of other specified B group vitamins: Secondary | ICD-10-CM | POA: Diagnosis not present

## 2021-02-25 DIAGNOSIS — G4719 Other hypersomnia: Secondary | ICD-10-CM

## 2021-02-25 MED ORDER — AMPHETAMINE-DEXTROAMPHETAMINE 20 MG PO TABS
20.0000 mg | ORAL_TABLET | Freq: Two times a day (BID) | ORAL | 0 refills | Status: AC
Start: 2021-02-25 — End: ?

## 2021-02-25 MED ORDER — CYANOCOBALAMIN 1000 MCG/ML IJ SOLN: INTRAMUSCULAR | 3 refills | Status: DC

## 2021-02-25 MED ORDER — "SYRINGE 25G X 1"" 3 ML MISC": 1.0000 "application " | 1 refills | Status: AC

## 2021-02-25 NOTE — Patient Instructions (Signed)
B12 and Adderall refilled today Referral to sleep study has been placed Will need sleep study completed and / or adult ADD assessment completed prior to any further refills on Adderall

## 2021-02-25 NOTE — Progress Notes (Signed)
Established Patient Office Visit  Subjective:  Patient ID: Barbara Mason, female    DOB: November 15, 1980  Age: 40 y.o. MRN: [id number]  CC:  Chief Complaint  Patient presents with   ADHD    HPI Barbara Mason presents for Adderall refill.  Patient states that she was diagnosed with excessive daytime sleepiness and her PCP prior to the last started her on Adderall 20 mg intermittent release to take twice daily as needed.  She was also diagnosed with B12 deficiency and has been doing every other weekly injections of this.  She definitely notices a difference when she does not have the B12 injections or the Adderall.  States that she has only been needing the Adderall about 3 days out of the week.  She was scheduled to see pulmonology for a sleep study last year after seeing Dr. Artis Flock, but states she did not have this done due to her son being a senior in high school and too much going on. No snoring. No apnea signs per husband. Usually gets up at least once to use the bathroom at night.  Despite what she thinks is relatively normal sleep, she still feels very tired.  Enjoys walking and yoga.  Works for the school system.  Most recent labs [facility] 01/31/21: Total cholesterol 160, LDL 75, HDL 72 TSH 3.04 HbA1c 5.2  Vit D 32.4 B12-  911 CMP normal   Past Medical History:  Diagnosis Date   Anxiety     Past Surgical History:  Procedure Laterality Date   WISDOM TOOTH EXTRACTION  1997    Family History  Problem Relation Age of Onset   Heart disease Father    High Cholesterol Father    Hypertension Sister    Cancer Maternal Grandmother    Diabetes Maternal Grandmother    High Cholesterol Maternal Grandmother    Cancer Paternal Grandmother    High Cholesterol Paternal Grandmother    Breast cancer Neg Hx     Social History   Socioeconomic History   Marital status: Married    Spouse name: Not on file   Number of children: Not on file    Years of education: Not on file   Highest education level: Not on file  Occupational History   Not on file  Tobacco Use   Smoking status: Never   Smokeless tobacco: Never  Vaping Use   Vaping Use: Never used  Substance and Sexual Activity   Alcohol use: Yes    Alcohol/week: 1.0 standard drink    Types: 1 Glasses of wine per week   Drug use: No   Sexual activity: Yes    Birth control/protection: None  Other Topics Concern   Not on file  Social History Narrative   Not on file   Social Determinants of Health   Financial Resource Strain: Not on file  Food Insecurity: Not on file  Transportation Needs: Not on file  Physical Activity: Not on file  Stress: Not on file  Social Connections: Not on file  Intimate Partner Violence: Not on file    Outpatient Medications Prior to Visit  Medication Sig Dispense Refill   OVER THE COUNTER MEDICATION 400 mg. OTC Magnesium     amphetamine-dextroamphetamine (ADDERALL) 20 MG tablet Take 1 tablet (20 mg total) by mouth 2 (two) times daily. 60 tablet 0   cyanocobalamin (,VITAMIN B-12,) 1000 MCG/ML injection INJECT ONCE q 2 weeks 10 mL 3   Syringe/Needle, Disp, (SYRINGE 3CC/25GX1") 25G  X 1" 3 ML MISC 1 application by Does not apply route once a week. 50 each 1   No facility-administered medications prior to visit.    No Known Allergies  ROS Review of Systems REFER TO HPI FOR PERTINENT POSITIVES AND NEGATIVES    Objective:    Physical Exam Vitals and nursing note reviewed.  Constitutional:      General: She is not in acute distress.    Appearance: Normal appearance. She is normal weight.  HENT:     Head: Normocephalic.     Right Ear: External ear normal.     Left Ear: External ear normal.     Nose: Nose normal.     Mouth/Throat:     Mouth: Mucous membranes are moist.  Eyes:     Extraocular Movements: Extraocular movements intact.     Conjunctiva/sclera: Conjunctivae normal.     Pupils: Pupils are equal, round, and  reactive to light.  Cardiovascular:     Rate and Rhythm: Normal rate and regular rhythm.     Pulses: Normal pulses.     Heart sounds: No murmur heard. Pulmonary:     Effort: Pulmonary effort is normal.     Breath sounds: Normal breath sounds.  Abdominal:     Tenderness: There is no abdominal tenderness.  Musculoskeletal:        General: Normal range of motion.     Cervical back: Normal range of motion.  Skin:    General: Skin is warm.  Neurological:     General: No focal deficit present.     Mental Status: She is alert and oriented to person, place, and time.     Gait: Gait normal.  Psychiatric:        Mood and Affect: Mood normal.        Behavior: Behavior normal.    BP 102/66   Pulse 70   Temp 97.8 F (36.6 C)   Ht 5\' 8"  (1.727 m)   Wt 132 lb 3.2 oz (60 kg)   LMP 02/02/2021   SpO2 99%   BMI 20.10 kg/m  Wt Readings from Last 3 Encounters:  02/25/21 132 lb 3.2 oz (60 kg)  07/18/20 135 lb 9.6 oz (61.5 kg)  01/19/20 133 lb 9.6 oz (60.6 kg)     Health Maintenance Due  Topic Date Due   Hepatitis C Screening  Never done   MAMMOGRAM  04/20/2018   PAP SMEAR-Modifier  01/11/2021    There are no preventive care reminders to display for this patient.  Lab Results  Component Value Date   TSH 1.38 01/19/2020   Lab Results  Component Value Date   WBC 4.7 01/19/2020   HGB 13.4 01/19/2020   HCT 39.4 01/19/2020   MCV 91.7 01/19/2020   PLT 239.0 01/19/2020   Lab Results  Component Value Date   NA 139 01/19/2020   K 4.9 01/19/2020   CO2 30 01/19/2020   GLUCOSE 81 01/19/2020   BUN 8 01/19/2020   CREATININE 0.82 01/19/2020   BILITOT 0.6 01/19/2020   ALKPHOS 51 01/19/2020   AST 18 01/19/2020   ALT 13 01/19/2020   PROT 6.5 01/19/2020   ALBUMIN 4.5 01/19/2020   CALCIUM 9.4 01/19/2020   GFR 76.68 01/19/2020   Lab Results  Component Value Date   CHOL 152 01/19/2020   Lab Results  Component Value Date   HDL 81.00 01/19/2020   Lab Results  Component Value  Date   LDLCALC 60 01/19/2020   Lab  Results  Component Value Date   TRIG 53.0 01/19/2020   Lab Results  Component Value Date   CHOLHDL 2 01/19/2020   Lab Results  Component Value Date   HGBA1C 4.7 03/19/2018      Assessment & Plan:   Problem List Items Addressed This Visit       Other   Hypersomnia (Chronic)   Relevant Medications   amphetamine-dextroamphetamine (ADDERALL) 20 MG tablet   Other Relevant Orders   Ambulatory referral to Sleep Studies   B12 deficiency   Other Visit Diagnoses     Excessive daytime sleepiness    -  Primary   Relevant Orders   Ambulatory referral to Sleep Studies       Meds ordered this encounter  Medications   cyanocobalamin (,VITAMIN B-12,) 1000 MCG/ML injection    Sig: INJECT ONCE q 2 weeks    Dispense:  10 mL    Refill:  3   amphetamine-dextroamphetamine (ADDERALL) 20 MG tablet    Sig: Take 1 tablet (20 mg total) by mouth 2 (two) times daily.    Dispense:  60 tablet    Refill:  0   Syringe/Needle, Disp, (SYRINGE 3CC/25GX1") 25G X 1" 3 ML MISC    Sig: 1 application by Does not apply route once a week.    Dispense:  50 each    Refill:  1    Follow-up: No follow-ups on file.   1. Excessive daytime sleepiness 2. B12 deficiency 3. Hypersomnia PDMP reviewed today and previous records personally reviewed by myself. I understand that patient has been taking Adderall 20 mg twice daily as needed usually about 3 days out of the week for excessive fatigue.  This is not something that I am comfortable continuing to prescribe without further work-up on her fatigue.  She will need to complete a sleep study and she may need to see an adult ADD specialist for assessment prior to any more refills.  I did refill for 30 days, but patient is understanding about my concern, especially because this medication does not come without risk.  I also refilled her B12 injections and will continue to monitor with blood work.  Good sleep hygiene advised.   Encouraged her to keep up the good work with yoga and walking.   Catherin Doorn M Erica Osuna, PA-C

## 2021-03-04 DIAGNOSIS — F4320 Adjustment disorder, unspecified: Secondary | ICD-10-CM | POA: Diagnosis not present

## 2021-04-04 DIAGNOSIS — F4320 Adjustment disorder, unspecified: Secondary | ICD-10-CM | POA: Diagnosis not present

## 2021-05-06 ENCOUNTER — Institutional Professional Consult (permissible substitution): Payer: Federal, State, Local not specified - PPO | Admitting: Neurology

## 2021-05-06 DIAGNOSIS — F4320 Adjustment disorder, unspecified: Secondary | ICD-10-CM | POA: Diagnosis not present

## 2021-05-21 DIAGNOSIS — Z1331 Encounter for screening for depression: Secondary | ICD-10-CM | POA: Diagnosis not present

## 2021-05-21 DIAGNOSIS — E538 Deficiency of other specified B group vitamins: Secondary | ICD-10-CM | POA: Diagnosis not present

## 2021-05-22 ENCOUNTER — Encounter: Payer: Federal, State, Local not specified - PPO | Admitting: Physician Assistant

## 2021-06-20 DIAGNOSIS — M542 Cervicalgia: Secondary | ICD-10-CM | POA: Diagnosis not present

## 2021-09-04 DIAGNOSIS — F902 Attention-deficit hyperactivity disorder, combined type: Secondary | ICD-10-CM | POA: Diagnosis not present

## 2021-09-04 DIAGNOSIS — R41840 Attention and concentration deficit: Secondary | ICD-10-CM | POA: Diagnosis not present

## 2021-09-12 ENCOUNTER — Institutional Professional Consult (permissible substitution): Payer: Federal, State, Local not specified - PPO | Admitting: Neurology

## 2021-10-23 DIAGNOSIS — D2362 Other benign neoplasm of skin of left upper limb, including shoulder: Secondary | ICD-10-CM | POA: Diagnosis not present

## 2021-10-23 DIAGNOSIS — D229 Melanocytic nevi, unspecified: Secondary | ICD-10-CM | POA: Diagnosis not present

## 2021-10-23 DIAGNOSIS — L821 Other seborrheic keratosis: Secondary | ICD-10-CM | POA: Diagnosis not present

## 2021-10-23 DIAGNOSIS — L578 Other skin changes due to chronic exposure to nonionizing radiation: Secondary | ICD-10-CM | POA: Diagnosis not present

## 2021-10-31 DIAGNOSIS — F4320 Adjustment disorder, unspecified: Secondary | ICD-10-CM | POA: Diagnosis not present

## 2021-11-13 DIAGNOSIS — F4320 Adjustment disorder, unspecified: Secondary | ICD-10-CM | POA: Diagnosis not present

## 2021-11-18 ENCOUNTER — Encounter: Payer: Self-pay | Admitting: Neurology

## 2021-11-18 ENCOUNTER — Ambulatory Visit: Payer: Federal, State, Local not specified - PPO | Admitting: Neurology

## 2021-11-18 VITALS — BP 110/69 | HR 81 | Ht 68.5 in | Wt 136.0 lb

## 2021-11-18 DIAGNOSIS — R351 Nocturia: Secondary | ICD-10-CM | POA: Diagnosis not present

## 2021-11-18 DIAGNOSIS — G4710 Hypersomnia, unspecified: Secondary | ICD-10-CM | POA: Diagnosis not present

## 2021-11-18 DIAGNOSIS — Z820 Family history of epilepsy and other diseases of the nervous system: Secondary | ICD-10-CM | POA: Diagnosis not present

## 2021-11-18 NOTE — Progress Notes (Signed)
Subjective:  ?  ?Patient ID: Barbara Mason is a 41 y.o. female. ? ?HPI ? ? ? ?Star Age, MD, PhD ?[facility] ?[address], [address] ?[phone] ?[place], [place] [address] ? ?Dear Dr. Jacalyn Lefevre,  ? ?I saw your patient, Barbara Mason, upon your kind request, in my sleep clinic today for initial consultation of her sleep disorder, in particular, her excessive daytime somnolence.  The patient is unaccompanied today.  As you know, Barbara Mason is a 41 year old right-handed woman with an underlying medical history of B12 deficiency, anxiety, and ADHD, who reports a 3 to 4-year history of excessive daytime somnolence.  She reports that she has had trouble sleeping since childhood, she is not sleeping as a child.  She is a light sleeper.  She feels that since 2019 her sleepiness has become worse to where she can take a nap if she is inactive.  She has never fallen asleep while driving or at the wheel.  She has a history of B12 deficiency and has been on B12 injections.  She was recently diagnosed with ADHD.  She has been on Adderall for the past few years.  Currently she is taking 20 mg twice daily as needed.  I reviewed your office note from 05/21/2021.  Her Epworth sleepiness score is 5 out of 24, fatigue severity score is 41 out of 63.  She reports no family history of narcolepsy, mom has sleep apnea and she suspects that her dad may have sleep apnea as well.  Patient is married and lives with her husband.  She has 2 children, both in college.  She works at a school in an administrative position.  Bedtime is generally around 10 and rise time around 5 AM.  She has nocturia about twice per average night and denies recurrent morning headaches or significant snoring.  They have 1 dog in the household.  She is a non-smoker and drinks alcohol occasionally, caffeine in the form of coffee, 1 cup/day on average.  She denies recurrent dreams or vivid dreams.  In fact, she does not recall dreaming  very often.   ? ?Her Past Medical History Is Significant For: ?Past Medical History:  ?Diagnosis Date  ? Anxiety   ? ? ?Her Past Surgical History Is Significant For: ?Past Surgical History:  ?Procedure Laterality Date  ? [place] EXTRACTION  1997  ? ? ?Her Family History Is Significant For: ?Family History  ?Problem Relation Age of Onset  ? Sleep apnea Mother   ? Sleep apnea Father   ?     likely has but has not been diagnosed  ? Heart disease Father   ? High Cholesterol Father   ? Snoring Father   ? Hypertension Sister   ? Cancer Maternal Grandmother   ? Diabetes Maternal Grandmother   ? High Cholesterol Maternal Grandmother   ? Cancer Paternal Grandmother   ? High Cholesterol Paternal Grandmother   ? Breast cancer Neg Hx   ? ? ?Her Social History Is Significant For: ?Social History  ? ?Socioeconomic History  ? Marital status: Married  ?  Spouse name: Not on file  ? Number of children: Not on file  ? Years of education: Not on file  ? Highest education level: Not on file  ?Occupational History  ? Not on file  ?Tobacco Use  ? Smoking status: Never  ? Smokeless tobacco: Never  ?Vaping Use  ? Vaping Use: Never used  ?Substance and Sexual Activity  ? Alcohol use: Yes  ?  Alcohol/week: 1.0 standard drink  ?  Types: 1 Glasses of wine per week  ? Drug use: No  ? Sexual activity: Yes  ?  Birth control/protection: None  ?Other Topics Concern  ? Not on file  ?Social History Narrative  ? Lives at home with husband. Husband travels for work during the week. Children are in college.   ? Right handed  ? Caffeine: 1 cup coffee in the morning  ? ?Social Determinants of Health  ? ?Financial Resource Strain: Not on file  ?Food Insecurity: Not on file  ?Transportation Needs: Not on file  ?Physical Activity: Not on file  ?Stress: Not on file  ?Social Connections: Not on file  ? ? ?Her Allergies Are:  ?No Known Allergies:  ? ?Her Current Medications Are:  ?Outpatient Encounter Medications as of 11/18/2021  ?Medication Sig  ?  amphetamine-dextroamphetamine (ADDERALL) 20 MG tablet Take 1 tablet (20 mg total) by mouth 2 (two) times daily. (Patient taking differently: Take 20 mg by mouth daily.)  ? cyanocobalamin (,VITAMIN B-12,) 1000 MCG/ML injection INJECT 1ML ONCE q 2 weeks  ? OVER THE COUNTER MEDICATION 400 mg. OTC Magnesium  ? Syringe/Needle, Disp, (SYRINGE 3CC/25GX1") 25G X 1" 3 ML MISC 1 application by Does not apply route once a week.  ? ?No facility-administered encounter medications on file as of 11/18/2021.  ?: ? ? ?Review of Systems:  ?Out of a complete 14 point review of systems, all are reviewed and negative with the exception of these symptoms as listed below: ? ?Review of Systems  ?Neurological:   ?     Patient is here alone for a sleep consult. She reports excessive daytime sleepiness since 2019. She reports she never really sleeps through the night, not even when she was a toddler. ESS 5, FSS 42.  ? ?Objective:  ?Neurological Exam ? ?Physical Exam ?Physical Examination:  ? ?Vitals:  ? 11/18/21 1526  ?BP: 110/69  ?Pulse: 81  ? ? ?General Examination: The patient is a very pleasant 41 y.o. female in no acute distress. She appears well-developed and well-nourished and well groomed.  ? ?HEENT: Normocephalic, atraumatic, pupils are equal, round and reactive to light, extraocular tracking is good without limitation to gaze excursion or nystagmus noted. Hearing is grossly intact. Face is symmetric with normal facial animation. Speech is clear with no dysarthria noted. There is no hypophonia. There is no lip, neck/head, jaw or voice tremor. Neck is supple with full range of passive and active motion. There are no carotid bruits on auscultation. Oropharynx exam reveals: mild mouth dryness, good dental hygiene and mild airway crowding, due to small airway entry.  Mallampati class I.  Neck circumference of 12 1/4 inches.  Tongue protrudes centrally and palate elevates symmetrically.  Tonsils small, minimal overbite.  ? ?Chest: Clear to  auscultation without wheezing, rhonchi or crackles noted. ? ?Heart: S1+S2+0, regular and normal without murmurs, rubs or gallops noted.  ? ?Abdomen: Soft, non-tender and non-distended. ? ?Extremities: There is no obvious edema.   ? ?Skin: Warm and dry without trophic changes noted.  ? ?Musculoskeletal: exam reveals no obvious joint deformities.  ? ?Neurologically:  ?Mental status: The patient is awake, alert and oriented in all 4 spheres. Her immediate and remote memory, attention, language skills and fund of knowledge are appropriate. There is no evidence of aphasia, agnosia, apraxia or anomia. Speech is clear with normal prosody and enunciation. Thought process is linear. Mood is normal and affect is normal.  ?Cranial nerves II - XII  are as described above under HEENT exam.  ?Motor exam: Normal bulk, strength and tone is noted. There is no obvious tremor.  Fine motor skills and coordination: grossly intact.  ?Cerebellar testing: No dysmetria or intention tremor. There is no truncal or gait ataxia.  ?Sensory exam: intact to light touch in the upper and lower extremities.  ?Gait, station and balance: She stands easily. No veering to one side is noted. No leaning to one side is noted. Posture is age-appropriate and stance is narrow based. Gait shows normal stride length and normal pace. No problems turning are noted.  ? ?Assessment and Plan:  ?In summary, Lazariah Scollon is a very pleasant 41 y.o.-year old female with an underlying medical history of B12 deficiency, anxiety, and ADHD, who who presents for evaluation of her hypersomnolence.  History is no telltale for narcolepsy but an underlying hypersomnolence disorder is not excluded, history is not compelling for sleep disordered breathing but she does have a family history of sleep apnea.  I talked to the patient about sleep testing.  I would recommend an extended sleep evaluation with a nighttime sleep study followed by a daytime nap study.  I talked her  about the sleep study procedures at length and the protocol for testing.  She is advised to taper off her Adderall in preparation for testing and stay off of it for about 2 weeks prior to testing.  She is enc

## 2021-11-18 NOTE — Patient Instructions (Signed)
Thank you for choosing [facility] for your sleep related care! ?It was nice to meet you today! Here is what we discussed today and what we came up with as our plan for you:  ?  ?Based on your symptoms and your exam I believe you may have an underlying sleepiness condition. We will look into your severe sleepiness with a nighttime sleep study, followed by a daytime nap study. In preparation for sleep study testing:  ? ?Do not take any narcotic pain medication and do not start any new depression or anxiety medication or stimulant. Once we have the insurance authorization for sleep testing, we can proceed with scheduling.  You would have to taper your Adderall and be off of the Adderall for about 2 weeks prior to testing.  We do have a urine drug screen as part of our protocol for daytime nap testing, in order to make sure there is no other medications that would interfere with your test. ?Do no drive when sleepy!  ?Please keep your sleep schedule stable and do not add any additional psychotropic medications or excess caffeine in your day to day routine, in preparation of the study.  ?4.   Our sleep lab administrative assistant will call you to schedule your sleep study. If you don't hear back from her by about 2 weeks from now, please feel free to call her at [phone]. You can leave a message with your phone number and concerns, if you get the voicemail box. She will call back as soon as possible. ?  ?The overnight sleep study will help determine whether you do or do not have OSA and how severe it is. Even, if you have mild OSA, I may want you to consider treatment with CPAP, as treatment of even borderline or mild sleep apnea can result and improvement of symptoms such as sleep disruption, daytime sleepiness, nighttime bathroom breaks, restless leg symptoms, improvement of headache syndromes, even improved mood disorder.  ? ?Please remember, the long-term risks and ramifications of untreated  moderate to severe obstructive sleep apnea are: increased Cardiovascular disease, including congestive heart failure, stroke, difficult to control hypertension, treatment resistant obesity, arrhythmias, especially irregular heartbeat commonly known as A. Fib. (atrial fibrillation); even type 2 diabetes has been linked to untreated OSA.  ? ?Sleep apnea can cause disruption of sleep and sleep deprivation in most cases, which, in turn, can cause recurrent headaches, problems with memory, mood, concentration, focus, and vigilance. Most people with untreated sleep apnea report excessive daytime sleepiness, which can affect their ability to drive. Please do not drive if you feel sleepy. Patients with sleep apnea can also develop difficulty initiating and maintaining sleep (aka insomnia).  ? ?Having sleep apnea may increase your risk for other sleep disorders, including involuntary behaviors sleep such as sleep terrors, sleep talking, sleepwalking.   ? ?Having sleep apnea can also increase your risk for restless leg syndrome and leg movements at night.  ? ?Please note that untreated obstructive sleep apnea may carry additional perioperative morbidity. Patients with significant obstructive sleep apnea (typically, in the moderate to severe degree) should receive, if possible, perioperative PAP (positive airway pressure) therapy and the surgeons and particularly the anesthesiologists should be informed of the diagnosis and the severity of the sleep disordered breathing.  ? ?I will plan to see you back after your sleep study to go over the test results and where to go from there. We will call you after your sleep study to advise about the  results (most likely, you will hear from my nurse) and to set up an appointment at the time, as necessary.   ? ?

## 2021-11-20 DIAGNOSIS — F4320 Adjustment disorder, unspecified: Secondary | ICD-10-CM | POA: Diagnosis not present

## 2021-12-06 DIAGNOSIS — E538 Deficiency of other specified B group vitamins: Secondary | ICD-10-CM | POA: Diagnosis not present

## 2021-12-16 DIAGNOSIS — F4320 Adjustment disorder, unspecified: Secondary | ICD-10-CM | POA: Diagnosis not present

## 2021-12-26 DIAGNOSIS — F4320 Adjustment disorder, unspecified: Secondary | ICD-10-CM | POA: Diagnosis not present

## 2022-01-16 DIAGNOSIS — F4320 Adjustment disorder, unspecified: Secondary | ICD-10-CM | POA: Diagnosis not present

## 2022-01-27 DIAGNOSIS — F4320 Adjustment disorder, unspecified: Secondary | ICD-10-CM | POA: Diagnosis not present

## 2022-02-17 DIAGNOSIS — R7989 Other specified abnormal findings of blood chemistry: Secondary | ICD-10-CM | POA: Diagnosis not present

## 2022-02-17 DIAGNOSIS — E538 Deficiency of other specified B group vitamins: Secondary | ICD-10-CM | POA: Diagnosis not present

## 2022-02-17 DIAGNOSIS — Z0000 Encounter for general adult medical examination without abnormal findings: Secondary | ICD-10-CM | POA: Diagnosis not present

## 2022-02-20 DIAGNOSIS — R41840 Attention and concentration deficit: Secondary | ICD-10-CM | POA: Diagnosis not present

## 2022-02-20 DIAGNOSIS — Z1331 Encounter for screening for depression: Secondary | ICD-10-CM | POA: Diagnosis not present

## 2022-02-20 DIAGNOSIS — Z1339 Encounter for screening examination for other mental health and behavioral disorders: Secondary | ICD-10-CM | POA: Diagnosis not present

## 2022-02-20 DIAGNOSIS — Z0000 Encounter for general adult medical examination without abnormal findings: Secondary | ICD-10-CM | POA: Diagnosis not present

## 2022-02-26 DIAGNOSIS — F4320 Adjustment disorder, unspecified: Secondary | ICD-10-CM | POA: Diagnosis not present

## 2022-03-11 ENCOUNTER — Telehealth: Payer: Self-pay

## 2022-03-11 NOTE — Telephone Encounter (Signed)
LVM for pt to call back to let sleep lab know if she would like to proceed with sleep studies.   

## 2022-03-19 ENCOUNTER — Encounter (INDEPENDENT_AMBULATORY_CARE_PROVIDER_SITE_OTHER): Payer: Self-pay

## 2022-03-19 NOTE — Telephone Encounter (Signed)
We have attempted to call the patient two times to schedule sleep study.  Patient has been unavailable at the phone numbers we have on file and has not returned our calls. If patient calls back we will schedule them for their sleep study.  

## 2022-04-02 DIAGNOSIS — F4320 Adjustment disorder, unspecified: Secondary | ICD-10-CM | POA: Diagnosis not present

## 2022-04-11 DIAGNOSIS — F4320 Adjustment disorder, unspecified: Secondary | ICD-10-CM | POA: Diagnosis not present

## 2022-04-16 ENCOUNTER — Other Ambulatory Visit: Payer: Self-pay

## 2022-04-16 MED ORDER — CYANOCOBALAMIN 1000 MCG/ML IJ SOLN: INTRAMUSCULAR | 3 refills | Status: DC

## 2022-04-24 DIAGNOSIS — L989 Disorder of the skin and subcutaneous tissue, unspecified: Secondary | ICD-10-CM | POA: Diagnosis not present

## 2022-04-30 DIAGNOSIS — F4320 Adjustment disorder, unspecified: Secondary | ICD-10-CM | POA: Diagnosis not present

## 2022-05-21 DIAGNOSIS — F4323 Adjustment disorder with mixed anxiety and depressed mood: Secondary | ICD-10-CM | POA: Diagnosis not present

## 2022-05-30 DIAGNOSIS — F4323 Adjustment disorder with mixed anxiety and depressed mood: Secondary | ICD-10-CM | POA: Diagnosis not present

## 2022-06-06 DIAGNOSIS — F4323 Adjustment disorder with mixed anxiety and depressed mood: Secondary | ICD-10-CM | POA: Diagnosis not present

## 2022-06-11 DIAGNOSIS — F4323 Adjustment disorder with mixed anxiety and depressed mood: Secondary | ICD-10-CM | POA: Diagnosis not present

## 2022-06-25 DIAGNOSIS — F4323 Adjustment disorder with mixed anxiety and depressed mood: Secondary | ICD-10-CM | POA: Diagnosis not present

## 2022-06-26 DIAGNOSIS — R41840 Attention and concentration deficit: Secondary | ICD-10-CM | POA: Diagnosis not present

## 2022-06-27 DIAGNOSIS — F4320 Adjustment disorder, unspecified: Secondary | ICD-10-CM | POA: Diagnosis not present

## 2022-07-08 DIAGNOSIS — F4323 Adjustment disorder with mixed anxiety and depressed mood: Secondary | ICD-10-CM | POA: Diagnosis not present

## 2022-07-16 DIAGNOSIS — F4320 Adjustment disorder, unspecified: Secondary | ICD-10-CM | POA: Diagnosis not present

## 2022-08-13 DIAGNOSIS — Z01419 Encounter for gynecological examination (general) (routine) without abnormal findings: Secondary | ICD-10-CM | POA: Diagnosis not present

## 2022-08-13 DIAGNOSIS — Z1231 Encounter for screening mammogram for malignant neoplasm of breast: Secondary | ICD-10-CM | POA: Diagnosis not present

## 2022-08-13 DIAGNOSIS — Z1151 Encounter for screening for human papillomavirus (HPV): Secondary | ICD-10-CM | POA: Diagnosis not present

## 2022-08-13 DIAGNOSIS — Z7689 Persons encountering health services in other specified circumstances: Secondary | ICD-10-CM | POA: Diagnosis not present

## 2022-08-13 DIAGNOSIS — Z124 Encounter for screening for malignant neoplasm of cervix: Secondary | ICD-10-CM | POA: Diagnosis not present

## 2022-08-18 ENCOUNTER — Other Ambulatory Visit: Payer: Self-pay | Admitting: Obstetrics and Gynecology

## 2022-08-18 DIAGNOSIS — R928 Other abnormal and inconclusive findings on diagnostic imaging of breast: Secondary | ICD-10-CM

## 2022-08-19 DIAGNOSIS — F4320 Adjustment disorder, unspecified: Secondary | ICD-10-CM | POA: Diagnosis not present

## 2022-08-23 ENCOUNTER — Ambulatory Visit
Admission: RE | Admit: 2022-08-23 | Discharge: 2022-08-23 | Disposition: A | Discharge status: Home / Self Care | Payer: Federal, State, Local not specified - PPO | Source: Ambulatory Visit | Attending: Obstetrics and Gynecology | Admitting: Obstetrics and Gynecology

## 2022-08-23 DIAGNOSIS — R928 Other abnormal and inconclusive findings on diagnostic imaging of breast: Secondary | ICD-10-CM

## 2022-08-23 DIAGNOSIS — R922 Inconclusive mammogram: Secondary | ICD-10-CM | POA: Diagnosis not present

## 2022-08-26 DIAGNOSIS — F4320 Adjustment disorder, unspecified: Secondary | ICD-10-CM | POA: Diagnosis not present

## 2022-09-03 DIAGNOSIS — F4320 Adjustment disorder, unspecified: Secondary | ICD-10-CM | POA: Diagnosis not present

## 2022-09-16 DIAGNOSIS — Z630 Problems in relationship with spouse or partner: Secondary | ICD-10-CM | POA: Diagnosis not present

## 2022-09-16 DIAGNOSIS — F4322 Adjustment disorder with anxiety: Secondary | ICD-10-CM | POA: Diagnosis not present

## 2022-09-23 DIAGNOSIS — F4322 Adjustment disorder with anxiety: Secondary | ICD-10-CM | POA: Diagnosis not present

## 2022-09-30 DIAGNOSIS — F4322 Adjustment disorder with anxiety: Secondary | ICD-10-CM | POA: Diagnosis not present

## 2022-10-14 DIAGNOSIS — F4322 Adjustment disorder with anxiety: Secondary | ICD-10-CM | POA: Diagnosis not present

## 2022-10-16 DIAGNOSIS — D8989 Other specified disorders involving the immune mechanism, not elsewhere classified: Secondary | ICD-10-CM | POA: Diagnosis not present

## 2022-10-16 DIAGNOSIS — L609 Nail disorder, unspecified: Secondary | ICD-10-CM | POA: Diagnosis not present

## 2022-10-16 DIAGNOSIS — H16223 Keratoconjunctivitis sicca, not specified as Sjogren's, bilateral: Secondary | ICD-10-CM | POA: Diagnosis not present

## 2022-10-22 DIAGNOSIS — F4322 Adjustment disorder with anxiety: Secondary | ICD-10-CM | POA: Diagnosis not present

## 2022-10-28 DIAGNOSIS — F4322 Adjustment disorder with anxiety: Secondary | ICD-10-CM | POA: Diagnosis not present

## 2022-10-29 DIAGNOSIS — L700 Acne vulgaris: Secondary | ICD-10-CM | POA: Diagnosis not present

## 2022-10-29 DIAGNOSIS — L739 Follicular disorder, unspecified: Secondary | ICD-10-CM | POA: Diagnosis not present

## 2022-10-29 DIAGNOSIS — L578 Other skin changes due to chronic exposure to nonionizing radiation: Secondary | ICD-10-CM | POA: Diagnosis not present

## 2022-10-29 DIAGNOSIS — D239 Other benign neoplasm of skin, unspecified: Secondary | ICD-10-CM | POA: Diagnosis not present

## 2022-10-30 DIAGNOSIS — R41840 Attention and concentration deficit: Secondary | ICD-10-CM | POA: Diagnosis not present

## 2022-10-30 DIAGNOSIS — L709 Acne, unspecified: Secondary | ICD-10-CM | POA: Diagnosis not present

## 2022-11-04 DIAGNOSIS — F4322 Adjustment disorder with anxiety: Secondary | ICD-10-CM | POA: Diagnosis not present

## 2022-11-18 DIAGNOSIS — F4322 Adjustment disorder with anxiety: Secondary | ICD-10-CM | POA: Diagnosis not present

## 2022-11-25 DIAGNOSIS — F4322 Adjustment disorder with anxiety: Secondary | ICD-10-CM | POA: Diagnosis not present

## 2022-12-02 DIAGNOSIS — F4322 Adjustment disorder with anxiety: Secondary | ICD-10-CM | POA: Diagnosis not present

## 2022-12-30 DIAGNOSIS — F4322 Adjustment disorder with anxiety: Secondary | ICD-10-CM | POA: Diagnosis not present

## 2023-01-06 DIAGNOSIS — N898 Other specified noninflammatory disorders of vagina: Secondary | ICD-10-CM | POA: Diagnosis not present

## 2023-01-06 DIAGNOSIS — N926 Irregular menstruation, unspecified: Secondary | ICD-10-CM | POA: Diagnosis not present

## 2023-01-06 DIAGNOSIS — N939 Abnormal uterine and vaginal bleeding, unspecified: Secondary | ICD-10-CM | POA: Diagnosis not present

## 2023-01-06 DIAGNOSIS — Z113 Encounter for screening for infections with a predominantly sexual mode of transmission: Secondary | ICD-10-CM | POA: Diagnosis not present

## 2023-01-20 DIAGNOSIS — F4322 Adjustment disorder with anxiety: Secondary | ICD-10-CM | POA: Diagnosis not present

## 2023-01-27 DIAGNOSIS — N939 Abnormal uterine and vaginal bleeding, unspecified: Secondary | ICD-10-CM | POA: Diagnosis not present

## 2023-01-27 DIAGNOSIS — N921 Excessive and frequent menstruation with irregular cycle: Secondary | ICD-10-CM | POA: Diagnosis not present

## 2023-02-03 DIAGNOSIS — F4322 Adjustment disorder with anxiety: Secondary | ICD-10-CM | POA: Diagnosis not present

## 2023-02-17 DIAGNOSIS — F4322 Adjustment disorder with anxiety: Secondary | ICD-10-CM | POA: Diagnosis not present

## 2023-02-24 DIAGNOSIS — F4322 Adjustment disorder with anxiety: Secondary | ICD-10-CM | POA: Diagnosis not present

## 2023-03-11 DIAGNOSIS — E538 Deficiency of other specified B group vitamins: Secondary | ICD-10-CM | POA: Diagnosis not present

## 2023-06-29 ENCOUNTER — Other Ambulatory Visit: Payer: Self-pay | Admitting: Physician Assistant

## 2023-07-01 DIAGNOSIS — F4322 Adjustment disorder with anxiety: Secondary | ICD-10-CM | POA: Diagnosis not present

## 2023-07-22 DIAGNOSIS — F4322 Adjustment disorder with anxiety: Secondary | ICD-10-CM | POA: Diagnosis not present

## 2023-08-10 DIAGNOSIS — F4322 Adjustment disorder with anxiety: Secondary | ICD-10-CM | POA: Diagnosis not present

## 2023-08-25 DIAGNOSIS — Z1231 Encounter for screening mammogram for malignant neoplasm of breast: Secondary | ICD-10-CM | POA: Diagnosis not present

## 2023-08-25 DIAGNOSIS — Z6820 Body mass index (BMI) 20.0-20.9, adult: Secondary | ICD-10-CM | POA: Diagnosis not present

## 2023-08-25 DIAGNOSIS — Z01419 Encounter for gynecological examination (general) (routine) without abnormal findings: Secondary | ICD-10-CM | POA: Diagnosis not present

## 2023-09-01 DIAGNOSIS — F4322 Adjustment disorder with anxiety: Secondary | ICD-10-CM | POA: Diagnosis not present

## 2023-09-22 DIAGNOSIS — F4322 Adjustment disorder with anxiety: Secondary | ICD-10-CM | POA: Diagnosis not present

## 2023-09-23 ENCOUNTER — Other Ambulatory Visit: Payer: Self-pay | Admitting: Physician Assistant

## 2023-09-29 DIAGNOSIS — R41840 Attention and concentration deficit: Secondary | ICD-10-CM | POA: Diagnosis not present

## 2023-09-29 DIAGNOSIS — Z131 Encounter for screening for diabetes mellitus: Secondary | ICD-10-CM | POA: Diagnosis not present

## 2023-10-14 DIAGNOSIS — D2362 Other benign neoplasm of skin of left upper limb, including shoulder: Secondary | ICD-10-CM | POA: Diagnosis not present

## 2023-10-14 DIAGNOSIS — L720 Epidermal cyst: Secondary | ICD-10-CM | POA: Diagnosis not present

## 2023-10-14 DIAGNOSIS — L578 Other skin changes due to chronic exposure to nonionizing radiation: Secondary | ICD-10-CM | POA: Diagnosis not present

## 2023-10-14 DIAGNOSIS — L281 Prurigo nodularis: Secondary | ICD-10-CM | POA: Diagnosis not present

## 2023-11-15 ENCOUNTER — Other Ambulatory Visit: Payer: Self-pay | Admitting: Physician Assistant

## 2024-02-16 DIAGNOSIS — M791 Myalgia, unspecified site: Secondary | ICD-10-CM | POA: Diagnosis not present

## 2024-03-28 DIAGNOSIS — F4389 Other reactions to severe stress: Secondary | ICD-10-CM | POA: Diagnosis not present

## 2024-04-08 DIAGNOSIS — F4389 Other reactions to severe stress: Secondary | ICD-10-CM | POA: Diagnosis not present

## 2024-04-25 DIAGNOSIS — F4389 Other reactions to severe stress: Secondary | ICD-10-CM | POA: Diagnosis not present

## 2024-05-19 DIAGNOSIS — F4389 Other reactions to severe stress: Secondary | ICD-10-CM | POA: Diagnosis not present

## 2024-06-07 DIAGNOSIS — Z79899 Other long term (current) drug therapy: Secondary | ICD-10-CM | POA: Diagnosis not present

## 2024-06-07 DIAGNOSIS — E538 Deficiency of other specified B group vitamins: Secondary | ICD-10-CM | POA: Diagnosis not present

## 2024-06-07 DIAGNOSIS — Z0189 Encounter for other specified special examinations: Secondary | ICD-10-CM | POA: Diagnosis not present

## 2024-06-14 DIAGNOSIS — Z1331 Encounter for screening for depression: Secondary | ICD-10-CM | POA: Diagnosis not present

## 2024-06-14 DIAGNOSIS — Z0000 Encounter for general adult medical examination without abnormal findings: Secondary | ICD-10-CM | POA: Diagnosis not present

## 2024-06-14 DIAGNOSIS — Z1339 Encounter for screening examination for other mental health and behavioral disorders: Secondary | ICD-10-CM | POA: Diagnosis not present

## 2024-06-14 DIAGNOSIS — R41840 Attention and concentration deficit: Secondary | ICD-10-CM | POA: Diagnosis not present

## 2024-06-21 DIAGNOSIS — N951 Menopausal and female climacteric states: Secondary | ICD-10-CM | POA: Diagnosis not present

## 2024-06-21 DIAGNOSIS — R6882 Decreased libido: Secondary | ICD-10-CM | POA: Diagnosis not present

## 2024-09-08 ENCOUNTER — Other Ambulatory Visit: Payer: Self-pay | Admitting: Obstetrics and Gynecology

## 2024-09-08 DIAGNOSIS — Z1231 Encounter for screening mammogram for malignant neoplasm of breast: Secondary | ICD-10-CM

## 2024-09-09 ENCOUNTER — Encounter: Payer: Self-pay | Admitting: Internal Medicine

## 2024-09-09 DIAGNOSIS — Z1231 Encounter for screening mammogram for malignant neoplasm of breast: Secondary | ICD-10-CM

## 2024-09-26 ENCOUNTER — Ambulatory Visit
